# Patient Record
Sex: Female | Born: 1970 | Race: White | Hispanic: No | Marital: Married | Smoking: Never smoker
Health system: Southern US, Community
[De-identification: ages and names within clinical notes are randomized; demographics above are authoritative.]

## PROBLEM LIST (undated history)

## (undated) HISTORY — PX: BREAST BIOPSY: SHX20

## (undated) HISTORY — PX: DILATION AND CURETTAGE OF UTERUS: SHX78

---

## 2003-05-21 ENCOUNTER — Other Ambulatory Visit: Admission: RE | Admit: 2003-05-21 | Discharge: 2003-05-21 | Payer: Self-pay | Admitting: Obstetrics and Gynecology

## 2003-09-23 ENCOUNTER — Encounter (INDEPENDENT_AMBULATORY_CARE_PROVIDER_SITE_OTHER): Payer: Self-pay | Admitting: Specialist

## 2003-09-23 ENCOUNTER — Ambulatory Visit (HOSPITAL_COMMUNITY): Admission: RE | Admit: 2003-09-23 | Discharge: 2003-09-23 | Payer: Self-pay | Admitting: Obstetrics and Gynecology

## 2004-02-16 ENCOUNTER — Other Ambulatory Visit: Admission: RE | Admit: 2004-02-16 | Discharge: 2004-02-16 | Payer: Self-pay | Admitting: Obstetrics and Gynecology

## 2004-06-10 ENCOUNTER — Ambulatory Visit (HOSPITAL_COMMUNITY): Admission: RE | Admit: 2004-06-10 | Discharge: 2004-06-10 | Payer: Self-pay | Admitting: Obstetrics and Gynecology

## 2004-09-12 ENCOUNTER — Inpatient Hospital Stay (HOSPITAL_COMMUNITY): Admission: AD | Admit: 2004-09-12 | Discharge: 2004-09-15 | Payer: Self-pay | Admitting: Obstetrics and Gynecology

## 2004-10-22 ENCOUNTER — Other Ambulatory Visit: Admission: RE | Admit: 2004-10-22 | Discharge: 2004-10-22 | Payer: Self-pay | Admitting: Obstetrics and Gynecology

## 2007-06-14 ENCOUNTER — Encounter: Admission: RE | Admit: 2007-06-14 | Discharge: 2007-06-14 | Payer: Self-pay | Admitting: Obstetrics and Gynecology

## 2007-06-29 ENCOUNTER — Encounter: Admission: RE | Admit: 2007-06-29 | Discharge: 2007-06-29 | Payer: Self-pay | Admitting: Obstetrics and Gynecology

## 2008-03-04 ENCOUNTER — Encounter (INDEPENDENT_AMBULATORY_CARE_PROVIDER_SITE_OTHER): Payer: Self-pay | Admitting: Obstetrics and Gynecology

## 2008-03-04 ENCOUNTER — Ambulatory Visit (HOSPITAL_COMMUNITY): Admission: RE | Admit: 2008-03-04 | Discharge: 2008-03-04 | Payer: Self-pay | Admitting: Obstetrics and Gynecology

## 2009-02-16 ENCOUNTER — Inpatient Hospital Stay (HOSPITAL_COMMUNITY): Admission: AD | Admit: 2009-02-16 | Discharge: 2009-02-18 | Payer: Self-pay | Admitting: Obstetrics and Gynecology

## 2009-02-16 ENCOUNTER — Encounter (HOSPITAL_COMMUNITY): Payer: Self-pay | Admitting: Obstetrics and Gynecology

## 2010-07-14 ENCOUNTER — Encounter
Admission: RE | Admit: 2010-07-14 | Discharge: 2010-07-14 | Payer: Self-pay | Source: Home / Self Care | Attending: Obstetrics and Gynecology | Admitting: Obstetrics and Gynecology

## 2010-07-25 ENCOUNTER — Encounter: Payer: Self-pay | Admitting: Obstetrics and Gynecology

## 2010-10-09 LAB — CBC
MCHC: 34.5 g/dL (ref 30.0–36.0)
MCV: 90.5 fL (ref 78.0–100.0)
MCV: 90.5 fL (ref 78.0–100.0)
Platelets: 165 10*3/uL (ref 150–400)
Platelets: 191 10*3/uL (ref 150–400)
RBC: 3.83 MIL/uL — ABNORMAL LOW (ref 3.87–5.11)
WBC: 11 10*3/uL — ABNORMAL HIGH (ref 4.0–10.5)

## 2010-10-09 LAB — RPR: RPR Ser Ql: NONREACTIVE

## 2010-11-16 NOTE — Op Note (Signed)
Turner, Debbie               ACCOUNT NO.:  [id number]   MEDICAL RECORD NO.:  [id number]          PATIENT TYPE:  AMB   LOCATION:                                [facility]:  [facility]   PHYSICIAN:  Michelle L. Grewal, M.D.DATE OF BIRTH:  10-Apr-1971   DATE OF PROCEDURE:  DATE OF DISCHARGE:                               OPERATIVE REPORT   PREOPERATIVE DIAGNOSIS:  Missed abortion at 12 weeks with twins.   POSTOPERATIVE DIAGNOSIS:  Missed abortion at 12 weeks with twins.   PROCEDURE:  D&A with ultrasound guidance.   SURGEON:  Michelle L. Grewal, MD   ANESTHESIA:  LMA with local.   ESTIMATED BLOOD LOSS:  200 mL.   PROCEDURE:  The patient was taken to the operating room after informed  consent was obtained.  Laminaria removed that were placed the day before  by myself.  She was prepped and draped in usual fashion.  Ultrasound  arrived and placed a speculum in the vagina.  The cervix was grasped  with a tenaculum and a paracervical block was performed.  She was  already dilated about a centimeter and a half.  I inserted with  ultrasound guidance #12 suction curette and did a thorough suction  curettage with ultrasound observation that both pseudocysts were  suctioned through the curette.  After that, I inserted a sharp curette  and did a thorough uterine curettage again under ultrasound guidance.  I  placed polyp tissue forceps inside the uterine cavity, removed the  remainder of the placental tissue, and a final suction tube curettage  was performed and uterine cavity was clean.  The uterus decompressed  nicely.  She did have some moderate bleeding as we were doing the D&A.  I did give her Pitocin and she did get some Methergine as well.  By the  time I completed the procedure, we confirmed that she had no retained  tissue by ultrasound.  I then noted that her bleeding subsided.  All  instruments were removed from the vagina.  All sponge, lap, and  instrument counts were correct x2.   The patient will be sent home with  the Methergine series as well as pain medication.  She will follow up  with me in my office in 1 week.      Michelle L. Vincente Poli, M.D.  Electronically Signed     MLG/MEDQ  D:  03/04/2008  T:  03/05/2008  Job:  [id number]

## 2010-11-16 NOTE — Op Note (Signed)
NAMEASIANNA, BRUNDAGE               ACCOUNT NO.:  [id number]   MEDICAL RECORD NO.:  [id number]          PATIENT TYPE:  INP   LOCATION:  9123                          [facility]ACILITY:  [facility]   PHYSICIAN:  Michelle L. Grewal, M.D.DATE OF BIRTH:  1971/01/18   DATE OF PROCEDURE:  02/17/2009  DATE OF DISCHARGE:                               OPERATIVE REPORT   PREOPERATIVE DIAGNOSES:  Intrauterine pregnancy at 39 weeks,  polyhydramnios, failure to progress, and desires permanent  sterilization.   POSTOPERATIVE DIAGNOSES:  Intrauterine pregnancy at 39 weeks,  polyhydramnios, failure to progress, and desires permanent  sterilization, in transverse position.   PROCEDURES:  Primary low transverse cesarean section and bilateral tubal  ligation.   SURGEON:  Michelle L. Grewal, MD   ANESTHESIA:  Epidural.   FINDINGS:  Female infant, cephalic presentation, Apgars 8 at 1 minute  and 9 at 5 minutes, weighing 7 pounds 3 ounces.   SPECIMENS:  Fallopian tube segments sent to Pathology.   ESTIMATED BLOOD LOSS:  700 mL.   COMPLICATIONS:  None.   DESCRIPTION OF PROCEDURE:  The patient was taken to the operating room.  Her epidural was dosed.  She was found to be adequate.  Time-out was  performed.  A low transverse incision was made and it was carried down  to the fascia.  The fascia scored in the midline and extended laterally.  The rectus muscles were separated in the midline.  The peritoneum was  entered bluntly.  The peritoneal incision was then stretched.  The  bladder blade was inserted.  The lower uterine segment was identified.  The bladder flap was created sharply and then digitally.  The bladder  blade was then readjusted.  A low transverse incision was made in the  uterus.  The baby was in transverse position and the head was  hyperextended.  The head was flexed slightly, a vacuum extractor was  placed on the head, the baby was delivered with a vacuum extractor after  it was rotated.  The  baby was a female infant, Apgars 8 at 1 minute and  9 at 5 minutes, weighing 7 pounds 3 ounces.  The cord was clamped and  cut.  The baby was handed to the awaiting pediatrician.  The placenta  was manually removed, noted be normal intact with a three-vessel cord.  The uterus was exteriorized and cleared of all clots and debris.  The  uterine incision was closed using 0 chromic in a running locked stitch.  At this point, we performed a modified bilateral tubal ligation by  identifying each midportion of the fallopian tube and tying the  midportion off using plain gut suture x2 and then tied off knuckle of  each fallopian tube segment was then resected using Metzenbaum scissors.  The ends were then burned with the Bovie.  Hemostasis was excellent.  The uterus was returned to the abdomen.  All pedicles were inspected and  noted to be hemostatic.  The rectus muscles were reapproximated using 0  Vicryl.  The fascia was closed using 0 Vicryl, starting  each corner meeting in the midline  after irrigation of subcutaneous  layer.  The skin was closed with subcuticular using 3-0 Monocryl.  The  skin was closed with Dermabond.  All sponge, lap, and instrument counts  were correct x2.  The patient went to recovery room in stable condition.      Michelle L. Vincente Poli, M.D.  Electronically Signed     MLG/MEDQ  D:  02/17/2009  T:  02/17/2009  Job:  [id number]

## 2010-11-19 NOTE — Op Note (Signed)
NAME:  Debbie Turner, Debbie Turner                         ACCOUNT NO.:  [id number]   MEDICAL RECORD NO.:  [id number]                   PATIENT TYPE:  AMB   LOCATION:  DAY                                  FACILITY:  [facility]   PHYSICIAN:  Michelle L. Vincente Poli, M.D.            DATE OF BIRTH:  27-May-1971   DATE OF PROCEDURE:  09/23/2003  DATE OF DISCHARGE:                                 OPERATIVE REPORT   PREOPERATIVE DIAGNOSIS:  Missed abortion.   POSTOPERATIVE DIAGNOSIS:  Missed abortion.   PROCEDURE:  Dilation and evacuation.   SURGEON:  Dr. Vincente Poli.   ANESTHESIA:  MAC for paracervical.   DESCRIPTION OF PROCEDURE:  The patient was taken to the operating room.  She  was given sedation, placed in the low lithotomy position.  Abdomen and  vagina were prepped and draped in the usual sterile fashion.  An in-and-out  catheter was used to empty the bladder.  Exam under anesthesia revealed the  uterus to be approximately 6 weeks size and mid positional.  After a sterile  drape was applied, the speculum was inserted into the vagina; the cervix was  grasped with a tenaculum and a paracervical block was performed in the  standard fashion at 5 and 7 o'clock.  The uterus is sounded to 7 cm and was  noted to be in the midline.  The cervical internal os was gently dilated  using Pratt dilators to a #25.  A #7 suction cannula was inserted into the  uterus, and suction curettage was performed which was thorough and grossly  consistent with products of conception.  After the suction curettage was  performed, the curette was removed.  The cannula was removed, and a sharp  curette was inserted into the uterus, and the uterus was thoroughly curetted  of all tissue.  All four walls were clear of tissue.  A final suction  curettage was performed with very __________ scant tissue obtained.  At the  end of the procedure, all instruments were removed from the vagina.  There  was no vaginal bleeding noted. The patient  received antibiotics prior to the  procedure.  She did get Toradol after the procedure.  All sponge, lap, and  instrument counts were correct x 2.  The patient tolerated the procedure  well and went to recovery room in stable condition.  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  She had received RhoGAM while she was in [place] when this  missed AB was initially diagnosed 2 weeks ago.  So, she will not need the  RhoGAM today.                                               Michelle L. Vincente Poli, M.D.    Florestine Avers  D:  09/23/2003  T:  09/23/2003  Job:  [id number]

## 2011-04-06 LAB — RH IMMUNE GLOBULIN WORKUP (NOT WOMEN'S HOSP)
ABO/RH(D): O NEG
Antibody Screen: NEGATIVE

## 2011-04-06 LAB — CBC
HCT: 36.3
RBC: 4.19

## 2011-06-16 ENCOUNTER — Other Ambulatory Visit: Payer: Self-pay | Admitting: Obstetrics and Gynecology

## 2011-06-16 DIAGNOSIS — Z1231 Encounter for screening mammogram for malignant neoplasm of breast: Secondary | ICD-10-CM

## 2011-07-21 ENCOUNTER — Ambulatory Visit: Payer: Self-pay

## 2011-08-01 ENCOUNTER — Ambulatory Visit
Admission: RE | Admit: 2011-08-01 | Discharge: 2011-08-01 | Disposition: A | Discharge status: Home / Self Care | Payer: BC Managed Care – PPO | Source: Ambulatory Visit | Attending: Obstetrics and Gynecology | Admitting: Obstetrics and Gynecology

## 2011-08-01 DIAGNOSIS — Z1231 Encounter for screening mammogram for malignant neoplasm of breast: Secondary | ICD-10-CM

## 2012-08-24 ENCOUNTER — Other Ambulatory Visit: Payer: Self-pay | Admitting: Family Medicine

## 2012-08-24 DIAGNOSIS — Z1231 Encounter for screening mammogram for malignant neoplasm of breast: Secondary | ICD-10-CM

## 2012-09-27 ENCOUNTER — Ambulatory Visit: Payer: BC Managed Care – PPO

## 2012-10-15 ENCOUNTER — Ambulatory Visit: Payer: BC Managed Care – PPO

## 2012-11-28 ENCOUNTER — Ambulatory Visit: Payer: BC Managed Care – PPO

## 2012-11-29 ENCOUNTER — Ambulatory Visit
Admission: RE | Admit: 2012-11-29 | Discharge: 2012-11-29 | Disposition: A | Discharge status: Home / Self Care | Payer: BC Managed Care – PPO | Source: Ambulatory Visit | Attending: Family Medicine | Admitting: Family Medicine

## 2012-11-29 DIAGNOSIS — Z1231 Encounter for screening mammogram for malignant neoplasm of breast: Secondary | ICD-10-CM

## 2013-10-02 ENCOUNTER — Other Ambulatory Visit (HOSPITAL_COMMUNITY)
Admission: RE | Admit: 2013-10-02 | Discharge: 2013-10-02 | Disposition: A | Discharge status: Home / Self Care | Payer: BC Managed Care – PPO | Source: Ambulatory Visit | Attending: Family Medicine | Admitting: Family Medicine

## 2013-10-02 ENCOUNTER — Other Ambulatory Visit: Payer: Self-pay | Admitting: Family Medicine

## 2013-10-02 DIAGNOSIS — Z124 Encounter for screening for malignant neoplasm of cervix: Secondary | ICD-10-CM | POA: Insufficient documentation

## 2014-02-03 ENCOUNTER — Other Ambulatory Visit: Payer: Self-pay

## 2014-02-03 DIAGNOSIS — Z1231 Encounter for screening mammogram for malignant neoplasm of breast: Secondary | ICD-10-CM

## 2014-02-20 ENCOUNTER — Ambulatory Visit
Admission: RE | Admit: 2014-02-20 | Discharge: 2014-02-20 | Disposition: A | Discharge status: Home / Self Care | Payer: BC Managed Care – PPO | Source: Ambulatory Visit

## 2014-02-20 DIAGNOSIS — Z1231 Encounter for screening mammogram for malignant neoplasm of breast: Secondary | ICD-10-CM

## 2015-03-02 ENCOUNTER — Other Ambulatory Visit: Payer: Self-pay

## 2015-03-02 DIAGNOSIS — Z1231 Encounter for screening mammogram for malignant neoplasm of breast: Secondary | ICD-10-CM

## 2015-04-09 ENCOUNTER — Ambulatory Visit
Admission: RE | Admit: 2015-04-09 | Discharge: 2015-04-09 | Disposition: A | Discharge status: Home / Self Care | Payer: BLUE CROSS/BLUE SHIELD | Source: Ambulatory Visit

## 2015-04-09 DIAGNOSIS — Z1231 Encounter for screening mammogram for malignant neoplasm of breast: Secondary | ICD-10-CM

## 2016-04-07 DIAGNOSIS — R946 Abnormal results of thyroid function studies: Secondary | ICD-10-CM | POA: Diagnosis not present

## 2016-04-11 ENCOUNTER — Other Ambulatory Visit: Payer: Self-pay | Admitting: Family Medicine

## 2016-04-11 DIAGNOSIS — R946 Abnormal results of thyroid function studies: Secondary | ICD-10-CM | POA: Diagnosis not present

## 2016-04-11 DIAGNOSIS — Z1231 Encounter for screening mammogram for malignant neoplasm of breast: Secondary | ICD-10-CM

## 2016-07-26 ENCOUNTER — Ambulatory Visit: Payer: BLUE CROSS/BLUE SHIELD

## 2016-08-01 ENCOUNTER — Ambulatory Visit
Admission: RE | Admit: 2016-08-01 | Discharge: 2016-08-01 | Disposition: A | Discharge status: Home / Self Care | Payer: BLUE CROSS/BLUE SHIELD | Source: Ambulatory Visit | Attending: Family Medicine | Admitting: Family Medicine

## 2016-08-01 DIAGNOSIS — Z1231 Encounter for screening mammogram for malignant neoplasm of breast: Secondary | ICD-10-CM

## 2016-11-15 ENCOUNTER — Other Ambulatory Visit (HOSPITAL_COMMUNITY)
Admission: RE | Admit: 2016-11-15 | Discharge: 2016-11-15 | Disposition: A | Discharge status: Home / Self Care | Payer: BLUE CROSS/BLUE SHIELD | Source: Ambulatory Visit | Attending: Family Medicine | Admitting: Family Medicine

## 2016-11-15 DIAGNOSIS — Z131 Encounter for screening for diabetes mellitus: Secondary | ICD-10-CM | POA: Diagnosis not present

## 2016-11-15 DIAGNOSIS — Z0000 Encounter for general adult medical examination without abnormal findings: Secondary | ICD-10-CM | POA: Diagnosis not present

## 2016-11-15 DIAGNOSIS — Z124 Encounter for screening for malignant neoplasm of cervix: Secondary | ICD-10-CM | POA: Insufficient documentation

## 2016-11-15 DIAGNOSIS — Z13220 Encounter for screening for lipoid disorders: Secondary | ICD-10-CM | POA: Diagnosis not present

## 2016-11-16 ENCOUNTER — Other Ambulatory Visit: Payer: Self-pay | Admitting: Family Medicine

## 2016-11-24 LAB — CYTOLOGY - PAP: Diagnosis: NEGATIVE

## 2017-08-30 DIAGNOSIS — D171 Benign lipomatous neoplasm of skin and subcutaneous tissue of trunk: Secondary | ICD-10-CM | POA: Diagnosis not present

## 2017-10-04 DIAGNOSIS — D171 Benign lipomatous neoplasm of skin and subcutaneous tissue of trunk: Secondary | ICD-10-CM | POA: Diagnosis not present

## 2017-12-06 ENCOUNTER — Encounter: Payer: Self-pay | Admitting: Family Medicine

## 2017-12-06 ENCOUNTER — Ambulatory Visit (INDEPENDENT_AMBULATORY_CARE_PROVIDER_SITE_OTHER): Payer: BLUE CROSS/BLUE SHIELD | Admitting: Family Medicine

## 2017-12-06 DIAGNOSIS — M79671 Pain in right foot: Secondary | ICD-10-CM | POA: Diagnosis not present

## 2017-12-07 ENCOUNTER — Encounter: Payer: Self-pay | Admitting: Family Medicine

## 2017-12-07 DIAGNOSIS — M79671 Pain in right foot: Secondary | ICD-10-CM | POA: Insufficient documentation

## 2017-12-07 NOTE — Progress Notes (Signed)
PCP: Kelton Pillar, MD  Subjective:   HPI: Patient is a 47 y.o. female here for custom orthotics.  Patient comes in today for custom orthotics for plantar fasciitis. She has had 2 months of plantar right foot pain back at the heel. Pain was aching, burning. Up to 5/10 in the morning. 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 Tried aleve icing, stretching. Going to [place] in a few weeks. No skin changes, numbness.  History reviewed. No pertinent past medical history.  Current Outpatient Medications on File Prior to Visit  Medication Sig Dispense Refill  . estradiol (VIVELLE-DOT) 0.0375 MG/24HR PLACE 1 PATCH TO SKIN TWICE A WEEK TRANSDERMAL  0  . progesterone (PROMETRIUM) 100 MG capsule Take 100 mg by mouth daily.  0   No current facility-administered medications on file prior to visit.     History reviewed. No pertinent surgical history.  No Known Allergies  Social History   Socioeconomic History  . Marital status: Married    Spouse name: Not on file  . Number of children: Not on file  . Years of education: Not on file  . Highest education level: Not on file  Occupational History  . Not on file  Social Needs  . Financial resource strain: Not on file  . Food insecurity:    Worry: Not on file    Inability: Not on file  . Transportation needs:    Medical: Not on file    Non-medical: Not on file  Tobacco Use  . Smoking status: Never Smoker  . Smokeless tobacco: Never Used  Substance and Sexual Activity  . Alcohol use: Not on file  . Drug use: Not on file  . Sexual activity: Not on file  Lifestyle  . Physical activity:    Days per week: Not on file    Minutes per session: Not on file  . Stress: Not on file  Relationships  . Social connections:    Talks on phone: Not on file    Gets together: Not on file    Attends religious service: Not on file    Active member of club or organization: Not on file    Attends meetings of clubs or organizations: Not on file   Relationship status: Not on file  . Intimate partner violence:    Fear of current or ex partner: Not on file    Emotionally abused: Not on file    Physically abused: Not on file    Forced sexual activity: Not on file  Other Topics Concern  . Not on file  Social History Narrative  . Not on file    History reviewed. No pertinent family history.  BP 130/73   Pulse 82   Ht 5\' 8"  (1.727 m)   Wt 145 lb (65.8 kg)   LMP 11/03/2012   BMI 22.05 kg/m   Review of Systems: See HPI above.     Objective:  Physical Exam:  Gen: NAD, comfortable in exam room  Right foot/ankle: Mild cavus.  No gross deformity, swelling, ecchymoses.  No hallux rigidus or valgus.  Transverse arch collapse. FROM with 5/5 strength. TTP mildly plantar fascia insertion on calcaneus laterally. Negative ant drawer and talar tilt.   Negative syndesmotic compression. Negative calcaneal squeeze. Thompsons test negative. NV intact distally.  Left foot/ankle: Mild cavus.  No gross deformity, swelling, ecchymoses.  Transverse arch collapse.  No hallux rigidus or valgus. FROM with 5/5 strength No TTP. Negative ant drawer and talar tilt.  Negative syndesmotic compression. Thompsons test negative. NV intact distally.   Assessment & Plan:  1. Right foot pain - 2/2 plantar fasciitis.  Reviewed home exercises, stretches, icing, tylenol, motrin, arch binder.  Custom orthotics made today and felt comfortable.  Patient was fitted for a : standard, cushioned, semi-rigid orthotic. The orthotic was heated and afterward the patient stood on the orthotic blank positioned on the orthotic stand. The patient was positioned in subtalar neutral position and 10 degrees of ankle dorsiflexion in a weight bearing stance. After completion of molding, a stable base was applied to the orthotic blank. The blank was ground to a stable position for weight bearing. Size: 9 orange Base: blue med density eva Posting: none Additional  orthotic padding: none Total prep time 40 minutes - >50% of which spent on counseling, answering questions, reviewing how to use, what to be aware of to come in for alterations, how to break in orthotics.

## 2017-12-07 NOTE — Assessment & Plan Note (Signed)
2/2 plantar fasciitis.  Reviewed home exercises, stretches, icing, tylenol, motrin, arch binder.  Custom orthotics made today and felt comfortable.  Patient was fitted for a : standard, cushioned, semi-rigid orthotic. The orthotic was heated and afterward the patient stood on the orthotic blank positioned on the orthotic stand. The patient was positioned in subtalar neutral position and 10 degrees of ankle dorsiflexion in a weight bearing stance. After completion of molding, a stable base was applied to the orthotic blank. The blank was ground to a stable position for weight bearing. Size: 9 orange Base: blue med density eva Posting: none Additional orthotic padding: none Total prep time 40 minutes - >50% of which spent on counseling, answering questions, reviewing how to use, what to be aware of to come in for alterations, how to break in orthotics.

## 2017-12-11 ENCOUNTER — Encounter: Payer: BLUE CROSS/BLUE SHIELD | Admitting: Sports Medicine

## 2017-12-11 ENCOUNTER — Encounter

## 2017-12-13 ENCOUNTER — Other Ambulatory Visit: Payer: Self-pay | Admitting: Family Medicine

## 2017-12-13 DIAGNOSIS — M79671 Pain in right foot: Secondary | ICD-10-CM | POA: Diagnosis not present

## 2017-12-13 DIAGNOSIS — Z1231 Encounter for screening mammogram for malignant neoplasm of breast: Secondary | ICD-10-CM

## 2017-12-20 DIAGNOSIS — R946 Abnormal results of thyroid function studies: Secondary | ICD-10-CM | POA: Diagnosis not present

## 2017-12-20 DIAGNOSIS — Z0000 Encounter for general adult medical examination without abnormal findings: Secondary | ICD-10-CM | POA: Diagnosis not present

## 2017-12-20 DIAGNOSIS — Z136 Encounter for screening for cardiovascular disorders: Secondary | ICD-10-CM | POA: Diagnosis not present

## 2017-12-20 DIAGNOSIS — Z131 Encounter for screening for diabetes mellitus: Secondary | ICD-10-CM | POA: Diagnosis not present

## 2018-01-08 ENCOUNTER — Ambulatory Visit
Admission: RE | Admit: 2018-01-08 | Discharge: 2018-01-08 | Disposition: A | Discharge status: Home / Self Care | Payer: BLUE CROSS/BLUE SHIELD | Source: Ambulatory Visit | Attending: Family Medicine | Admitting: Family Medicine

## 2018-01-08 DIAGNOSIS — Z1231 Encounter for screening mammogram for malignant neoplasm of breast: Secondary | ICD-10-CM | POA: Diagnosis not present

## 2018-02-07 DIAGNOSIS — M8588 Other specified disorders of bone density and structure, other site: Secondary | ICD-10-CM | POA: Diagnosis not present

## 2018-02-07 DIAGNOSIS — Z780 Asymptomatic menopausal state: Secondary | ICD-10-CM | POA: Diagnosis not present

## 2018-03-15 DIAGNOSIS — M79671 Pain in right foot: Secondary | ICD-10-CM | POA: Diagnosis not present

## 2018-03-26 DIAGNOSIS — M79671 Pain in right foot: Secondary | ICD-10-CM | POA: Diagnosis not present

## 2018-05-11 ENCOUNTER — Telehealth: Payer: Self-pay | Admitting: *Deleted

## 2018-05-11 ENCOUNTER — Ambulatory Visit (INDEPENDENT_AMBULATORY_CARE_PROVIDER_SITE_OTHER): Payer: BLUE CROSS/BLUE SHIELD

## 2018-05-11 ENCOUNTER — Encounter: Payer: Self-pay | Admitting: Sports Medicine

## 2018-05-11 ENCOUNTER — Other Ambulatory Visit: Payer: Self-pay | Admitting: Sports Medicine

## 2018-05-11 ENCOUNTER — Ambulatory Visit (INDEPENDENT_AMBULATORY_CARE_PROVIDER_SITE_OTHER): Payer: BLUE CROSS/BLUE SHIELD | Admitting: Sports Medicine

## 2018-05-11 DIAGNOSIS — S96911A Strain of unspecified muscle and tendon at ankle and foot level, right foot, initial encounter: Secondary | ICD-10-CM

## 2018-05-11 DIAGNOSIS — M79671 Pain in right foot: Secondary | ICD-10-CM

## 2018-05-11 DIAGNOSIS — M7671 Peroneal tendinitis, right leg: Secondary | ICD-10-CM

## 2018-05-11 MED ORDER — DICLOFENAC SODIUM 75 MG PO TBEC: 75.0000 mg | DELAYED_RELEASE_TABLET | Freq: Two times a day (BID) | ORAL | 0 refills | Status: DC

## 2018-05-11 NOTE — Telephone Encounter (Signed)
Orders given to Gretta Arab, RN for pre-cert.

## 2018-05-11 NOTE — Telephone Encounter (Signed)
-----   Message from Landis Martins, [place] sent at 05/11/2018  9:53 AM EST ----- Regarding: MRI R foot/ankle R/o tear at peroneal tendon lateral heel/ankle pain since March

## 2018-05-11 NOTE — Progress Notes (Signed)
Subjective: Debbie Turner is a 47 y.o. female patient presents to office with complaint of moderate heel pain on the Right since March of this year.  Patient reports that she went on a vacation where she did a lot of walking and standing and since she has had pain at her heel.  Patient was treated by Dr. 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  Patient reports that currently she is stretching icing wearing braces and night splint and taking Aleve in order to deal with the pain.  Patient denies any known history of injury besides going on vacation and doing a lot of walking. Denies any other pedal complaints.   Review of Systems  All other systems reviewed and are negative.    Patient Active Problem List   Diagnosis Date Noted  . Right foot pain 12/07/2017    Current Outpatient Medications on File Prior to Visit  Medication Sig Dispense Refill  . estradiol (VIVELLE-DOT) 0.0375 MG/24HR PLACE 1 PATCH TO SKIN TWICE A WEEK TRANSDERMAL  0  . progesterone (PROMETRIUM) 100 MG capsule Take 100 mg by mouth daily.  0   No current facility-administered medications on file prior to visit.     No Known Allergies  Objective: Physical Exam General: The patient is alert and oriented x3 in no acute distress.  Dermatology: Skin is warm, dry and supple bilateral lower extremities. Nails 1-10 are normal. There is no erythema, edema, no eccymosis, no open lesions present. Integument is otherwise unremarkable.  Vascular: Dorsalis Pedis pulse and Posterior Tibial pulse are 2/4 bilateral. Capillary fill time is immediate to all digits.  Neurological: Grossly  intact to light touch with an achilles reflex of +2/5 and a  negative Tinel's sign bilateral.  Musculoskeletal: Tenderness to palpation at lateral heel along the peroneal tendon course on right, no pain at plantar fascia no pain with compression of calcaneus bilateral. No pain with tuning fork to calcaneus bilateral. No pain with calf compression bilateral. There is decreased Ankle joint range of motion bilateral. All other joints range of motion within normal limits bilateral. Strength 5/5 in all groups bilateral.    Xray, Right foot:  Normal osseous mineralization. Joint spaces preserved. No fracture/dislocation/boney destruction. Calcaneal spur present with mild thickening of plantar fascia. No other soft tissue abnormalities or radiopaque foreign bodies.   Assessment and Plan: Problem List Items Addressed This Visit      Other   Right foot pain - Primary   Relevant Medications   diclofenac (VOLTAREN) 75 MG EC tablet   Other Relevant Orders   DG Foot Complete Right    Other Visit Diagnoses    Tear of tendon of right foot, initial encounter       Relevant Medications   diclofenac (VOLTAREN) 75 MG EC tablet   Inflammatory pain of right heel          -Complete examination performed.  -Xrays reviewed -Discussed with patient in detail the condition of possible peroneal tendon tear since her symptoms are lateral on her heel and less likely plantar fasciitis, how this occurs and  general treatment options. Explained both conservative and surgical treatments.  -Prescribed diclofenac to take as instructed and advised patient to discontinue Aleve, recommend rest ice elevation and protection with ankle gauntlet as dispensed this visit -Ordered MRI for further evaluation of peroneal tendon tear on the right since pain is ongoing and advised patient to continue with good supportive shoes and to discontinue stretching to avoid any worsening of possible tendon tear or injury -Patient to return to  office after MRI or sooner if problems or questions arise.  Landis Martins, DPM

## 2018-05-11 NOTE — Patient Instructions (Signed)

## 2018-05-18 NOTE — Telephone Encounter (Signed)
[facility] scheduled pt for MRI [id number] and [phone] for 05/25/2018 arrive 5:15pm for 5:30pm imaging. Faxed orders to [facility].

## 2018-05-18 NOTE — Telephone Encounter (Signed)
I informed pt of 05/24/2018 appt at [facility].

## 2018-05-24 DIAGNOSIS — M722 Plantar fascial fibromatosis: Secondary | ICD-10-CM | POA: Diagnosis not present

## 2018-05-24 DIAGNOSIS — R600 Localized edema: Secondary | ICD-10-CM | POA: Diagnosis not present

## 2018-05-30 ENCOUNTER — Encounter: Payer: Self-pay | Admitting: Sports Medicine

## 2018-06-04 ENCOUNTER — Other Ambulatory Visit: Payer: Self-pay | Admitting: Sports Medicine

## 2018-06-04 DIAGNOSIS — M79671 Pain in right foot: Secondary | ICD-10-CM

## 2018-06-04 DIAGNOSIS — S96911A Strain of unspecified muscle and tendon at ankle and foot level, right foot, initial encounter: Secondary | ICD-10-CM

## 2018-06-06 ENCOUNTER — Telehealth: Payer: Self-pay | Admitting: Sports Medicine

## 2018-06-06 NOTE — Telephone Encounter (Signed)
Patient wanted update on her test results.

## 2018-06-06 NOTE — Telephone Encounter (Signed)
Debbie Turner states she will fax the results to [phone].

## 2018-12-31 ENCOUNTER — Other Ambulatory Visit: Payer: Self-pay | Admitting: Family Medicine

## 2018-12-31 DIAGNOSIS — Z1231 Encounter for screening mammogram for malignant neoplasm of breast: Secondary | ICD-10-CM

## 2019-02-12 ENCOUNTER — Ambulatory Visit
Admission: RE | Admit: 2019-02-12 | Discharge: 2019-02-12 | Disposition: A | Discharge status: Home / Self Care | Payer: BC Managed Care – PPO | Source: Ambulatory Visit | Attending: Family Medicine | Admitting: Family Medicine

## 2019-02-12 ENCOUNTER — Other Ambulatory Visit: Payer: Self-pay

## 2019-02-12 DIAGNOSIS — Z1231 Encounter for screening mammogram for malignant neoplasm of breast: Secondary | ICD-10-CM

## 2019-05-10 ENCOUNTER — Encounter: Payer: BC Managed Care – PPO | Admitting: Plastic Surgery

## 2019-05-14 DIAGNOSIS — D2361 Other benign neoplasm of skin of right upper limb, including shoulder: Secondary | ICD-10-CM | POA: Diagnosis not present

## 2019-05-14 DIAGNOSIS — L578 Other skin changes due to chronic exposure to nonionizing radiation: Secondary | ICD-10-CM | POA: Diagnosis not present

## 2019-05-14 DIAGNOSIS — D2271 Melanocytic nevi of right lower limb, including hip: Secondary | ICD-10-CM | POA: Diagnosis not present

## 2019-05-14 DIAGNOSIS — L821 Other seborrheic keratosis: Secondary | ICD-10-CM | POA: Diagnosis not present

## 2019-05-21 ENCOUNTER — Ambulatory Visit (INDEPENDENT_AMBULATORY_CARE_PROVIDER_SITE_OTHER): Payer: Self-pay | Admitting: Plastic Surgery

## 2019-05-21 ENCOUNTER — Other Ambulatory Visit: Payer: Self-pay

## 2019-05-21 ENCOUNTER — Encounter: Payer: Self-pay | Admitting: Plastic Surgery

## 2019-05-21 DIAGNOSIS — Z719 Counseling, unspecified: Secondary | ICD-10-CM | POA: Insufficient documentation

## 2019-05-21 NOTE — Progress Notes (Signed)
Botulinum Toxin Procedure Note  Procedure: Cosmetic botulinum toxin  Pre-operative Diagnosis: Dynamic rhytides  Post-operative Diagnosis: Same  Complications:  None  Brief history: The patient desires botulinum toxin injection of her forehead. I discussed with the patient this proposed procedure of botulinum toxin injections, which is customized depending on the particular needs of the patient. It is performed on facial rhytids as a temporary correction. The alternatives were discussed with the patient. The risks were addressed including bleeding, scarring, infection, damage to deeper structures, asymmetry, and chronic pain, which may occur infrequently after a procedure. The individual's choice to undergo a surgical procedure is based on the comparison of risks to potential benefits. Other risks include unsatisfactory results, brow ptosis, eyelid ptosis, allergic reaction, temporary paralysis, which should go away with time, bruising, blurring disturbances and delayed healing. Botulinum toxin injections do not arrest the aging process or produce permanent tightening of the eyelid.  Operative intervention maybe necessary to maintain the results of a blepharoplasty or botulinum toxin. The patient understands and wishes to proceed. An informed consent was signed and informational brochures given to her prior to the procedure.  Procedure: The area was prepped with alcohol and dried with a clean gauze. Using a clean technique, the botulinum toxin was diluted with 1.25 cc of preservative-free normal saline which was slowly injected with an 18 gauge needle in a tuberculin syringes.  A 32 gauge needles were then used to inject the botulinum toxin. This mixture allow for an aliquot of 5 units per 0.1 cc in each injection site.    Subsequently the mixture was injected in the glabellar and forehead area with preservation of the temporal branch to the lateral eyebrow as well as into each lateral canthal area  beginning from the lateral orbital rim medial to the zygomaticus major in 3 separate areas. A total of 26 Units of botulinum toxin was used. The forehead and glabellar area was injected with care to inject intramuscular only while holding pressure on the supratrochlear vessels in each area during each injection on either side of the medial corrugators. The injection proceeded vertically superiorly to the medial 2/3 of the frontalis muscle and superior 2/3 of the lateral frontalis, again with preservation of the frontal branch.  The midface area was injected at the 3 sub-regions of the mid-face: zygomaticomalar region, anteromedial cheek region, and submalar region for a total of one syringe on each side of the face. The technique used was serial puncture with equal injections in the 3 sub-regions: the zygomaticomalar region, the anteromedial cheek, and the submalar region.  No complications were noted. Light pressure was held for 5 minutes. She was instructed explicitly in post-operative care.  Botox LOT:  [id number] C2 EXP:  7/23

## 2019-08-05 ENCOUNTER — Encounter: Payer: Self-pay | Admitting: Internal Medicine

## 2019-08-05 DIAGNOSIS — Z13220 Encounter for screening for lipoid disorders: Secondary | ICD-10-CM | POA: Diagnosis not present

## 2019-08-05 DIAGNOSIS — R142 Eructation: Secondary | ICD-10-CM | POA: Diagnosis not present

## 2019-08-05 DIAGNOSIS — Z0000 Encounter for general adult medical examination without abnormal findings: Secondary | ICD-10-CM | POA: Diagnosis not present

## 2019-08-05 DIAGNOSIS — K219 Gastro-esophageal reflux disease without esophagitis: Secondary | ICD-10-CM | POA: Diagnosis not present

## 2019-08-05 DIAGNOSIS — R946 Abnormal results of thyroid function studies: Secondary | ICD-10-CM | POA: Diagnosis not present

## 2019-08-09 ENCOUNTER — Encounter: Payer: BC Managed Care – PPO | Admitting: Plastic Surgery

## 2019-08-09 ENCOUNTER — Encounter: Payer: Self-pay | Admitting: Nurse Practitioner

## 2019-08-09 ENCOUNTER — Ambulatory Visit (INDEPENDENT_AMBULATORY_CARE_PROVIDER_SITE_OTHER): Payer: BC Managed Care – PPO | Admitting: Nurse Practitioner

## 2019-08-09 VITALS — BP 116/78 | HR 84 | Temp 97.8°F | Ht 66.0 in | Wt 150.2 lb

## 2019-08-09 DIAGNOSIS — Z800 Family history of malignant neoplasm of digestive organs: Secondary | ICD-10-CM | POA: Diagnosis not present

## 2019-08-09 DIAGNOSIS — K219 Gastro-esophageal reflux disease without esophagitis: Secondary | ICD-10-CM

## 2019-08-09 DIAGNOSIS — R1012 Left upper quadrant pain: Secondary | ICD-10-CM

## 2019-08-09 NOTE — Patient Instructions (Signed)
If you are age 49 or older, your body mass index should be between 23-30. Your Body mass index is 24.25 kg/m. If this is out of the aforementioned range listed, please consider follow up with your Primary Care Provider.  If you are age 59 or younger, your body mass index should be between 19-25. Your Body mass index is 24.25 kg/m. If this is out of the aformentioned range listed, please consider follow up with your Primary Care Provider.   You have been scheduled for an endoscopy and colonoscopy. Please follow the written instructions given to you at your visit today. Please pick up your prep supplies at the pharmacy within the next 1-3 days. If you use inhalers (even only as needed), please bring them with you on the day of your procedure. Your physician has requested that you go to www.startemmi.com and enter the access code given to you at your visit today. This web site gives a general overview about your procedure. However, you should still follow specific instructions given to you by our office regarding your preparation for the procedure.  Thank you for choosing me and [facility].   Tye Savoy, NP

## 2019-08-09 NOTE — Progress Notes (Signed)
ASSESSMENT / PLAN:   8.  49 year old female with a 3 week history of LUQ discomfort which started after taking a few doses of NSAIDs.  Symptoms improved but not resolved on PPI  -Avoid NSAIDs for now -Continue daily PPI -Further evaluation at time of EGD  2.  Family history of esophageal cancer in mother.  Barrett's esophagus in maternal uncle.  -Given family history of esophageal cancer in primary relative as well as for evaluation of #1 patient will be scheduled for EGD. The risks and benefits of EGD were discussed and the patient agrees to proceed.  Husband is patient of Dr. Carlean Purl, patient requesting Dr. Carlean Purl as her primary GI  3.  Colon cancer screening.  At time of EGD patient will undergo a screening colonoscopy.  No blood in stool or other worrisome features. -The risks and benefits of colonoscopy with possible polypectomy / biopsies were discussed and the patient agrees to proceed.  4. ? GERD. Throat clearing, mainly in am. Could be post-nasal drip. 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  Se has a family history of Barrett's esophagus / esophageal cancer in mother.  Paternal uncle had Barrett's esophagus.  Maternal grandfather passed away with colon cancer.  Patient's brother has ulcerative colitis. Lille saw PCP for wellness exam on 08/05/2019.  She had begun having LUQ discomfort after taking NSAIDs following her second Covid injection 07/15/2019.  She was started on Prilosec which has helped but not alleviated the discomfort.  H pylori ab was negative.  No nausea or vomiting.  Weight is stable.  No bowel changes or blood in stool.  Debbie Turner has frequent throat clearing but mainly in the mornings.  Someone told her a couple of years ago that it could be GERD.  She has taken a PPI off and on  for this but really has not paid attention as to whether it helps.  She was having some mild dysphagia around the time she was having " thyroid problems".  Dysphagia has pretty much resolved.  Sometimes she has little difficulty swallowing saliva.   Data Reviewed:  H. pylori antibody negative.  TSH normal 0.66.  No past medical history on file. No pertinent PMH   Past Surgical History:  Procedure Laterality Date  . BREAST BIOPSY Right    No family history on file. Social History   Tobacco Use  . Smoking status: Never Smoker  . Smokeless tobacco: Never Used  Substance Use Topics  . Alcohol use: Not on file  . Drug use: Not on file   Current Outpatient Medications  Medication Sig Dispense Refill  . diclofenac (VOLTAREN) 75 MG EC tablet Take 1 tablet (75 mg total) by mouth 2 (two) times daily. (Patient not taking: Reported on 05/21/2019) 30 tablet 0  . estradiol (VIVELLE-DOT) 0.0375 MG/24HR PLACE 1 PATCH TO SKIN TWICE A WEEK TRANSDERMAL  0  . progesterone (PROMETRIUM) 100 MG capsule Take 100 mg by mouth daily.  0   No current facility-administered medications for this visit.   No Known Allergies   Review of Systems: All systems reviewed and negative except where noted in HPI.   Creatinine clearance cannot be calculated (No successful lab value  found.)   Physical Exam:    Wt Readings from Last 3 Encounters:  12/06/17 145 lb (65.8 kg)    LMP 11/03/2012  Constitutional:  Pleasant female in no acute distress. Psychiatric: Normal mood and affect. Behavior is normal. EENT: Pupils normal.  Conjunctivae are normal. No scleral icterus. Neck supple.  Cardiovascular: Normal rate, regular rhythm. No edema Pulmonary/chest: Effort normal and breath sounds normal. No wheezing, rales or rhonchi. Abdominal: Soft, nondistended, nontender. Bowel sounds active throughout. There are no masses palpable. No hepatomegaly. Neurological: Alert and oriented to person place and time. Skin:  Skin is warm and dry. No rashes noted.  Debbie Savoy, NP  08/09/2019, 8:39 AM  Cc:  Referring Provider Kelton Pillar, MD

## 2019-08-16 ENCOUNTER — Encounter: Payer: Self-pay | Admitting: Internal Medicine

## 2019-08-16 ENCOUNTER — Other Ambulatory Visit: Payer: Self-pay

## 2019-08-16 ENCOUNTER — Ambulatory Visit (AMBULATORY_SURGERY_CENTER): Payer: BC Managed Care – PPO | Admitting: Internal Medicine

## 2019-08-16 VITALS — BP 89/61 | HR 78 | Temp 96.8°F | Resp 14 | Ht 66.0 in | Wt 150.0 lb

## 2019-08-16 DIAGNOSIS — K219 Gastro-esophageal reflux disease without esophagitis: Secondary | ICD-10-CM

## 2019-08-16 DIAGNOSIS — Z1211 Encounter for screening for malignant neoplasm of colon: Secondary | ICD-10-CM | POA: Diagnosis not present

## 2019-08-16 DIAGNOSIS — K228 Other specified diseases of esophagus: Secondary | ICD-10-CM | POA: Diagnosis not present

## 2019-08-16 DIAGNOSIS — D125 Benign neoplasm of sigmoid colon: Secondary | ICD-10-CM

## 2019-08-16 DIAGNOSIS — Z800 Family history of malignant neoplasm of digestive organs: Secondary | ICD-10-CM | POA: Diagnosis not present

## 2019-08-16 DIAGNOSIS — R1012 Left upper quadrant pain: Secondary | ICD-10-CM | POA: Diagnosis not present

## 2019-08-16 MED ORDER — SODIUM CHLORIDE 0.9 % IV SOLN: 500.0000 mL | Freq: Once | INTRAVENOUS | Status: DC

## 2019-08-16 NOTE — Op Note (Signed)
[place] Patient Name: Debbie Turner Procedure Date: 08/16/2019 11:09 AM MRN: [id number] Endoscopist: Gatha Mayer , MD Age: 49 Referring MD:  Date of Birth: August 23, 1970 Gender: Female Account #: [id number] Procedure:                Upper GI endoscopy Indications:              Abdominal pain in the left upper quadrant, Family                            history of esophageal cancer Medicines:                Propofol per Anesthesia, Monitored Anesthesia Care Procedure:                Pre-Anesthesia Assessment:                           - Prior to the procedure, a History and Physical                            was performed, and patient medications and                            allergies were reviewed. The patient's tolerance of                            previous anesthesia was also reviewed. The risks                            and benefits of the procedure and the sedation                            options and risks were discussed with the patient.                            All questions were answered, and informed consent                            was obtained. Prior Anticoagulants: The patient has                            taken no previous anticoagulant or antiplatelet                            agents. ASA Grade Assessment: II - A patient with                            mild systemic disease. After reviewing the risks                            and benefits, the patient was deemed in                            satisfactory condition to undergo the procedure.  After obtaining informed consent, the endoscope was                            passed under direct vision. Throughout the                            procedure, the patient's blood pressure, pulse, and                            oxygen saturations were monitored continuously. The                            Endoscope was introduced through the mouth, and   advanced to the second part of duodenum. The upper                            GI endoscopy was accomplished without difficulty.                            The patient tolerated the procedure well. Scope In: Scope Out: Findings:                 The Z-line was irregular and was found at the                            gastroesophageal junction. Biopsies were taken with                            a cold forceps for histology. Verification of                            patient identification for the specimen was done.                            Estimated blood loss was minimal.                           The entire examined stomach was normal.                           The examined duodenum was normal.                           The cardia and gastric fundus were normal on                            retroflexion.                           The exam was otherwise without abnormality. Complications:            No immediate complications. Estimated Blood Loss:     Estimated blood loss was minimal. Impression:               - Z-line irregular, at the gastroesophageal  junction. Biopsied due to family history of                            esophageal cancer.                           - Normal stomach.                           - Normal examined duodenum.                           - The examination was otherwise normal. Recommendation:           - Patient has a contact number available for                            emergencies. The signs and symptoms of potential                            delayed complications were discussed with the                            patient. Return to normal activities tomorrow.                            Written discharge instructions were provided to the                            patient.                           - Resume previous diet.                           - Continue present medications.                           - Await pathology  results.                           - May discontinue pantoprazole Gatha Mayer, MD 08/16/2019 11:51:21 AM This report has been signed electronically.

## 2019-08-16 NOTE — Progress Notes (Signed)
Temp-JB VS-DT 

## 2019-08-16 NOTE — Progress Notes (Signed)
Report given to PACU, vss 

## 2019-08-16 NOTE — Progress Notes (Signed)
Called to room to assist during endoscopic procedure.  Patient ID and intended procedure confirmed with present staff. Received instructions for my participation in the procedure from the performing physician.  

## 2019-08-16 NOTE — Op Note (Signed)
[place] Patient Name: Debbie Turner Procedure Date: 08/16/2019 11:07 AM MRN: [id number] Endoscopist: Gatha Mayer , MD Age: 49 Referring MD:  Date of Birth: 1970/09/03 Gender: Female Account #: [id number] Procedure:                Colonoscopy Indications:              Screening for colorectal malignant neoplasm, This                            is the patient's first colonoscopy Medicines:                Propofol per Anesthesia, Monitored Anesthesia Care Procedure:                Pre-Anesthesia Assessment:                           - Prior to the procedure, a History and Physical                            was performed, and patient medications and                            allergies were reviewed. The patient's tolerance of                            previous anesthesia was also reviewed. The risks                            and benefits of the procedure and the sedation                            options and risks were discussed with the patient.                            All questions were answered, and informed consent                            was obtained. Prior Anticoagulants: The patient has                            taken no previous anticoagulant or antiplatelet                            agents. ASA Grade Assessment: II - A patient with                            mild systemic disease. After reviewing the risks                            and benefits, the patient was deemed in                            satisfactory condition to undergo the procedure.  After obtaining informed consent, the colonoscope                            was passed under direct vision. Throughout the                            procedure, the patient's blood pressure, pulse, and                            oxygen saturations were monitored continuously. The                            Colonoscope was introduced through the anus and   advanced to the the cecum, identified by                            appendiceal orifice and ileocecal valve. The                            patient tolerated the procedure well. The                            colonoscopy was somewhat difficult due to                            significant looping. Successful completion of the                            procedure was aided by applying abdominal pressure. Scope In: 11:24:47 AM Scope Out: 11:41:52 AM Scope Withdrawal Time: 0 hours 11 minutes 23 seconds  Total Procedure Duration: 0 hours 17 minutes 5 seconds  Findings:                 The perianal and digital rectal examinations were                            normal.                           A diminutive polyp was found in the distal sigmoid                            colon. The polyp was sessile. The polyp was removed                            with a cold snare. Resection and retrieval were                            complete. Verification of patient identification                            for the specimen was done. Estimated blood loss was                            minimal.  The exam was otherwise without abnormality on                            direct and retroflexion views. Complications:            No immediate complications. Estimated Blood Loss:     Estimated blood loss was minimal. Impression:               - One diminutive polyp in the distal sigmoid colon,                            removed with a cold snare. Resected and retrieved.                           - The examination was otherwise normal on direct                            and retroflexion views. Recommendation:           - Patient has a contact number available for                            emergencies. The signs and symptoms of potential                            delayed complications were discussed with the                            patient. Return to normal activities tomorrow.                             Written discharge instructions were provided to the                            patient.                           - Resume previous diet.                           - Continue present medications.                           - Repeat colonoscopy is recommended. The                            colonoscopy date will be determined after pathology                            results from today's exam become available for                            review. Gatha Mayer, MD 08/16/2019 11:53:36 AM This report has been signed electronically.

## 2019-08-16 NOTE — Patient Instructions (Addendum)
Handouts given:  Polyps Resume previous diet Continue present medications Await pathology results Discontinue pantoprazole    The gastroesophegeal junction or Z line is irregular. I checked with biopsies looking for intestinal metaplasia, which is a pre-cancerous change.  If you did not have the family history of esophageal cancer I would not have taken these biopsies as it is not recommended for such minor changes per updated guidelines.  I found and removed one diminutive or tiny colon polyp.  I will let you know pathology results and recommendations by phone or letter.  I do not think you need to continue pantoprazole.  I appreciate the opportunity to care for you. Gatha Mayer, MD, FACG  YOU HAD AN ENDOSCOPIC PROCEDURE TODAY AT [facility]:   Refer to the procedure report that was given to you for any specific questions about what was found during the examination.  If the procedure report does not answer your questions, please call your gastroenterologist to clarify.  If you requested that your care partner not be given the details of your procedure findings, then the procedure report has been included in a sealed envelope for you to review at your convenience later.  YOU SHOULD EXPECT: Some feelings of bloating in the abdomen. Passage of more gas than usual.  Walking can help get rid of the air that was put into your GI tract during the procedure and reduce the bloating. If you had a lower endoscopy (such as a colonoscopy or flexible sigmoidoscopy) you may notice spotting of blood in your stool or on the toilet paper. If you underwent a bowel prep for your procedure, you may not have a normal bowel movement for a few days.  Please Note:  You might notice some irritation and congestion in your nose or some drainage.  This is from the oxygen used during your procedure.  There is no need for concern and it should clear up in a day or so.  SYMPTOMS TO REPORT  IMMEDIATELY:   Following lower endoscopy (colonoscopy or flexible sigmoidoscopy):  Excessive amounts of blood in the stool  Significant tenderness or worsening of abdominal pains  Swelling of the abdomen that is new, acute  Fever of 100F or higher   Following upper endoscopy (EGD)  Vomiting of blood or coffee ground material  New chest pain or pain under the shoulder blades  Painful or persistently difficult swallowing  New shortness of breath  Fever of 100F or higher  Black, tarry-looking stools  For urgent or emergent issues, a gastroenterologist can be reached at any hour by calling [phone].   DIET:  We do recommend a small meal at first, but then you may proceed to your regular diet.  Drink plenty of fluids but you should avoid alcoholic beverages for 24 hours.  ACTIVITY:  You should plan to take it easy for the rest of today and you should NOT DRIVE or use heavy machinery until tomorrow (because of the sedation medicines used during the test).    FOLLOW UP: Our staff will call the number listed on your records 48-72 hours following your procedure to check on you and address any questions or concerns that you may have regarding the information given to you following your procedure. If we do not reach you, we will leave a message.  We will attempt to reach you two times.  During this call, we will ask if you have developed any symptoms of COVID 19. If you develop any  symptoms (ie: fever, flu-like symptoms, shortness of breath, cough etc.) before then, please call [phone].  If you test positive for Covid 19 in the 2 weeks post procedure, please call and report this information to [place].    If any biopsies were taken you will be contacted by phone or by letter within the next 1-3 weeks.  Please call us at [phone] if you have not heard about the biopsies in 3 weeks.    SIGNATURES/CONFIDENTIALITY: You and/or your care partner have signed paperwork which will be  entered into your electronic medical record.  These signatures attest to the fact that that the information above on your After Visit Summary has been reviewed and is understood.  Full responsibility of the confidentiality of this discharge information lies with you and/or your care-partner.

## 2019-08-20 ENCOUNTER — Telehealth: Payer: Self-pay

## 2019-08-20 NOTE — Telephone Encounter (Signed)
  Follow up Call-  Call back number 08/16/2019  Post procedure Call Back phone  # [phone]  Permission to leave phone message Yes  Some recent data might be hidden     Patient questions:  Do you have a fever, pain , or abdominal swelling? No. Pain Score  0 *  Have you tolerated food without any problems? Yes.    Have you been able to return to your normal activities? Yes.    Do you have any questions about your discharge instructions: Diet   No. Medications  No. Follow up visit  No.  Do you have questions or concerns about your Care? No.  Actions: * If pain score is 4 or above: No action needed, pain <4.  1. Have you developed a fever since your procedure? no  2.   Have you had an respiratory symptoms (SOB or cough) since your procedure? no  3.   Have you tested positive for COVID 19 since your procedure no  4.   Have you had any family members/close contacts diagnosed with the COVID 19 since your procedure?  no   If yes to any of these questions please route to Joylene John, RN and Alphonsa Gin, Therapist, sports.

## 2019-08-22 ENCOUNTER — Encounter: Payer: Self-pay | Admitting: Internal Medicine

## 2019-08-22 DIAGNOSIS — Z860101 Personal history of adenomatous and serrated colon polyps: Secondary | ICD-10-CM | POA: Insufficient documentation

## 2019-08-22 DIAGNOSIS — Z86010 Personal history of colonic polyps: Secondary | ICD-10-CM

## 2019-08-22 HISTORY — DX: Personal history of colonic polyps: Z86.010

## 2019-08-22 HISTORY — DX: Personal history of adenomatous and serrated colon polyps: Z86.0101

## 2019-08-23 ENCOUNTER — Encounter: Payer: BC Managed Care – PPO | Admitting: Plastic Surgery

## 2019-09-06 ENCOUNTER — Encounter: Payer: BC Managed Care – PPO | Admitting: Plastic Surgery

## 2019-09-24 ENCOUNTER — Ambulatory Visit: Payer: BC Managed Care – PPO | Admitting: Internal Medicine

## 2019-09-27 ENCOUNTER — Other Ambulatory Visit: Payer: Self-pay

## 2019-09-27 ENCOUNTER — Encounter: Payer: Self-pay | Admitting: Plastic Surgery

## 2019-09-27 ENCOUNTER — Ambulatory Visit (INDEPENDENT_AMBULATORY_CARE_PROVIDER_SITE_OTHER): Payer: Self-pay | Admitting: Plastic Surgery

## 2019-09-27 VITALS — BP 128/82 | HR 68 | Temp 98.7°F | Ht 66.0 in | Wt 150.0 lb

## 2019-09-27 DIAGNOSIS — Z719 Counseling, unspecified: Secondary | ICD-10-CM

## 2019-09-27 NOTE — Progress Notes (Signed)

## 2020-03-11 ENCOUNTER — Encounter: Payer: BC Managed Care – PPO | Admitting: Plastic Surgery

## 2020-04-21 ENCOUNTER — Other Ambulatory Visit: Payer: Self-pay | Admitting: Family Medicine

## 2020-04-21 DIAGNOSIS — Z1231 Encounter for screening mammogram for malignant neoplasm of breast: Secondary | ICD-10-CM

## 2020-05-16 ENCOUNTER — Ambulatory Visit: Payer: BC Managed Care – PPO

## 2020-06-14 DIAGNOSIS — Z20822 Contact with and (suspected) exposure to covid-19: Secondary | ICD-10-CM | POA: Diagnosis not present

## 2020-06-30 ENCOUNTER — Other Ambulatory Visit: Payer: Self-pay

## 2020-06-30 ENCOUNTER — Ambulatory Visit
Admission: RE | Admit: 2020-06-30 | Discharge: 2020-06-30 | Disposition: A | Discharge status: Home / Self Care | Payer: BC Managed Care – PPO | Source: Ambulatory Visit | Attending: Family Medicine | Admitting: Family Medicine

## 2020-06-30 DIAGNOSIS — Z1231 Encounter for screening mammogram for malignant neoplasm of breast: Secondary | ICD-10-CM | POA: Diagnosis not present

## 2020-07-02 ENCOUNTER — Ambulatory Visit: Payer: BC Managed Care – PPO

## 2020-08-26 DIAGNOSIS — Z124 Encounter for screening for malignant neoplasm of cervix: Secondary | ICD-10-CM | POA: Diagnosis not present

## 2020-08-26 DIAGNOSIS — Z13220 Encounter for screening for lipoid disorders: Secondary | ICD-10-CM | POA: Diagnosis not present

## 2020-08-26 DIAGNOSIS — Z131 Encounter for screening for diabetes mellitus: Secondary | ICD-10-CM | POA: Diagnosis not present

## 2020-08-26 DIAGNOSIS — K219 Gastro-esophageal reflux disease without esophagitis: Secondary | ICD-10-CM | POA: Diagnosis not present

## 2020-08-26 DIAGNOSIS — R946 Abnormal results of thyroid function studies: Secondary | ICD-10-CM | POA: Diagnosis not present

## 2020-08-26 DIAGNOSIS — Z0000 Encounter for general adult medical examination without abnormal findings: Secondary | ICD-10-CM | POA: Diagnosis not present

## 2020-11-06 DIAGNOSIS — H5213 Myopia, bilateral: Secondary | ICD-10-CM | POA: Diagnosis not present

## 2020-11-06 DIAGNOSIS — H04122 Dry eye syndrome of left lacrimal gland: Secondary | ICD-10-CM | POA: Diagnosis not present

## 2021-04-05 DIAGNOSIS — L821 Other seborrheic keratosis: Secondary | ICD-10-CM | POA: Diagnosis not present

## 2021-04-05 DIAGNOSIS — L570 Actinic keratosis: Secondary | ICD-10-CM | POA: Diagnosis not present

## 2021-04-28 NOTE — Progress Notes (Signed)
[facility] New Patient Evaluation and Consultation  Referring Provider: Kelton Pillar, MD PCP: Kelton Pillar, MD Date of Service: 04/29/2021  SUBJECTIVE Chief Complaint: New Patient (Initial Visit)- incontinence  History of Present Illness: Debbie Turner is a 50 y.o. White or Caucasian female presenting for evaluation of stress incontinence.     Urinary Symptoms: Leaks urine with cough/ sneeze, laughing, exercise, lifting, going from sitting to standing, during sex, with a full bladder, with movement to the bathroom, and with urgency Leaks 3-4 time(s) per day. Tries not to drink as much water so that she doesn't have to go to the bathroom. UUI> SUI, but leaks a lot with exercise.  Pad use: 3 pads per day.   She is bothered by her UI symptoms.  Day time voids 5-6.  Nocturia: 1 times per night to void. Voiding dysfunction: she empties her bladder well.  does not use a catheter to empty bladder.  When urinating, she feels dribbling after finishing Drinks: coffee in AM, otherwise water during the day UTIs:  0  UTI's in the last year.   Denies history of blood in urine and kidney or bladder stones  Pelvic Organ Prolapse Symptoms:                  She Denies a feeling of a bulge the vaginal area.   Bowel Symptom: Bowel movements: 1 time(s) per day Stool consistency: soft  Straining: no.  Splinting: no.  Incomplete evacuation: no.  She Denies accidental bowel leakage / fecal incontinence Bowel regimen: diet  Sexual Function Sexually active: yes.  Sexual orientation:  heterosexual Pain with sex: No  Pelvic Pain Denies pelvic pain   Past Medical History:  Past Medical History:  Diagnosis Date   Hx of adenomatous polyp of colon 08/22/2019     Past Surgical History:   Past Surgical History:  Procedure Laterality Date   BREAST BIOPSY Right    CESAREAN SECTION     DILATION AND CURETTAGE OF UTERUS     x2     Past OB/GYN History: OB History   Gravida Para Term Preterm AB Living  6       2 4   SAB IAB Ectopic Multiple Live Births  2       4    # Outcome Date GA Lbr Len/2nd Weight Sex Delivery Anes PTL Lv  6 Gravida           5 Gravida           4 Gravida           3 Gravida           2 SAB           1 SAB             Vaginal deliveries: 2,  Vacuum deliveries: 1, Cesarean section: 1 Menopausal: Yes, Denies vaginal bleeding since menopause Last pap smear was feb 2022- neg.  Any history of abnormal pap smears: yes.   Medications: She has a current medication list which includes the following prescription(s): calcium carbonate-vit d-min, mirabegron er, and multivitamin.   Allergies: Patient has No Known Allergies.   Social History:  Social History   Tobacco Use   Smoking status: Never   Smokeless tobacco: Never  Vaping Use   Vaping Use: Never used  Substance Use Topics   Alcohol use: Yes    Comment: rare   Drug use: Never    Relationship status: married She lives  with husband and 2 children.   She is employed as a Immunologist at [employer]. Regular exercise: Yes: walking 30-45 min 5x week History of abuse: No  Family History:   Family History  Problem Relation Age of Onset   Esophageal cancer Mother    Hypertension Father    Ulcerative colitis Brother    Colon cancer Maternal Grandfather    Barrett's esophagus Maternal Uncle    Rectal cancer Neg Hx    Stomach cancer Neg Hx      Review of Systems: Review of Systems  Constitutional:  Positive for malaise/fatigue. Negative for fever and weight loss.  Respiratory:  Negative for cough, shortness of breath and wheezing.   Cardiovascular:  Positive for palpitations. Negative for chest pain and leg swelling.  Gastrointestinal:  Negative for abdominal pain and blood in stool.  Genitourinary:  Negative for dysuria.  Musculoskeletal:  Negative for myalgias.  Skin:  Negative for rash.  Neurological:  Positive for headaches. Negative for dizziness.  Endo/Heme/Allergies:   Does not bruise/bleed easily.  Psychiatric/Behavioral:  Negative for depression. The patient is nervous/anxious.     OBJECTIVE Physical Exam: Vitals:   04/29/21 1039  BP: 125/80  Pulse: 86  Weight: 150 lb (68 kg)  Height: 5\' 6"  (1.676 m)    Physical Exam Constitutional:      General: She is not in acute distress. Pulmonary:     Effort: Pulmonary effort is normal.  Abdominal:     General: There is no distension.     Palpations: Abdomen is soft.     Tenderness: There is no abdominal tenderness. There is no rebound.  Musculoskeletal:        General: No swelling. Normal range of motion.  Skin:    General: Skin is warm and dry.     Findings: No rash.  Neurological:     Mental Status: She is alert and oriented to person, place, and time.  Psychiatric:        Mood and Affect: Mood normal.        Behavior: Behavior normal.     GU / Detailed Urogynecologic Evaluation:  Pelvic Exam: Normal external female genitalia; Bartholin's and Skene's glands normal in appearance; urethral meatus normal in appearance, no urethral masses or discharge.   CST: negative  Speculum exam reveals normal vaginal mucosa without atrophy. Cervix normal appearance. Uterus normal single, nontender. Adnexa no mass, fullness, tenderness.    Pelvic floor strength II/V  Pelvic floor musculature: Right levator non-tender, Right obturator non-tender, Left levator non-tender, Left obturator non-tender  POP-Q:   POP-Q  -3                                            Aa   -3                                           Ba  -7.5                                              C   3  Gh  3.5                                            Pb  9                                            tvl   -2                                            Ap  -2                                            Bp  -8                                              D     Rectal Exam:   Normal external rectum  Post-Void Residual (PVR) by Bladder Scan: In order to evaluate bladder emptying, we discussed obtaining a postvoid residual and she agreed to this procedure.  Procedure: The ultrasound unit was placed on the patient's abdomen in the suprapubic region after the patient had voided. A PVR of 6 ml was obtained by bladder scan.  Laboratory Results: POC urine: negative   ASSESSMENT AND PLAN Ms. Carrender is a 50 y.o. with:  1. Overactive bladder   2. SUI (stress urinary incontinence, female)   3. Urinary frequency     OAB - We discussed the symptoms of overactive bladder (OAB), which include urinary urgency, urinary frequency, nocturia, with or without urge incontinence.  While we do not know the exact etiology of OAB, several treatment options exist. We discussed management including behavioral therapy (decreasing bladder irritants, urge suppression strategies, timed voids, bladder retraining), physical therapy, medication; for refractory cases posterior tibial nerve stimulation, sacral neuromodulation, and intravesical botulinum toxin injection.  - Prescribed Myrbetriq 25mg  daily. For Beta-3 agonist medication, we discussed the potential side effect of elevated blood pressure which is more likely to occur in individuals with uncontrolled hypertension.  2. SUI -For treatment of stress urinary incontinence,  non-surgical options include expectant management, weight loss, physical therapy, as well as a pessary.  Surgical options include a midurethral sling, Burch urethropexy, and transurethral injection of a bulking agent. - She is interested in starting with physical therapy, referral placed.   Follow up 6 weeks  Jaquita Folds, MD   Medical Decision Making:  - Reviewed/ ordered a clinical laboratory test - Review and summation of prior records

## 2021-04-29 ENCOUNTER — Encounter: Payer: Self-pay | Admitting: Obstetrics and Gynecology

## 2021-04-29 ENCOUNTER — Other Ambulatory Visit: Payer: Self-pay

## 2021-04-29 ENCOUNTER — Ambulatory Visit (INDEPENDENT_AMBULATORY_CARE_PROVIDER_SITE_OTHER): Payer: BC Managed Care – PPO | Admitting: Obstetrics and Gynecology

## 2021-04-29 VITALS — BP 125/80 | HR 86 | Ht 66.0 in | Wt 150.0 lb

## 2021-04-29 DIAGNOSIS — N3281 Overactive bladder: Secondary | ICD-10-CM

## 2021-04-29 DIAGNOSIS — R350 Frequency of micturition: Secondary | ICD-10-CM

## 2021-04-29 DIAGNOSIS — N393 Stress incontinence (female) (male): Secondary | ICD-10-CM

## 2021-04-29 LAB — POCT URINALYSIS DIPSTICK
Appearance: NORMAL
Bilirubin, UA: NEGATIVE
Blood, UA: NEGATIVE
Glucose, UA: NEGATIVE
Ketones, UA: NEGATIVE
Leukocytes, UA: NEGATIVE
Nitrite, UA: NEGATIVE
Protein, UA: NEGATIVE
Spec Grav, UA: 1.01 (ref 1.010–1.025)
Urobilinogen, UA: 0.2 E.U./dL
pH, UA: 6.5 (ref 5.0–8.0)

## 2021-04-29 MED ORDER — MIRABEGRON ER 25 MG PO TB24: 25.0000 mg | ORAL_TABLET | Freq: Every day | ORAL | 5 refills | Status: AC

## 2021-06-01 ENCOUNTER — Ambulatory Visit: Payer: BC Managed Care – PPO | Admitting: Physical Therapy

## 2021-06-14 ENCOUNTER — Ambulatory Visit: Payer: BC Managed Care – PPO | Admitting: Obstetrics and Gynecology

## 2021-06-24 ENCOUNTER — Encounter: Payer: BC Managed Care – PPO | Admitting: Physical Therapy

## 2021-07-01 ENCOUNTER — Encounter: Payer: BC Managed Care – PPO | Admitting: Physical Therapy

## 2021-07-08 ENCOUNTER — Encounter: Payer: BC Managed Care – PPO | Admitting: Physical Therapy

## 2021-07-15 ENCOUNTER — Encounter: Payer: BC Managed Care – PPO | Admitting: Physical Therapy

## 2021-08-05 ENCOUNTER — Other Ambulatory Visit: Payer: Self-pay | Admitting: Family Medicine

## 2021-08-05 ENCOUNTER — Other Ambulatory Visit: Payer: Self-pay | Admitting: Internal Medicine

## 2021-08-05 DIAGNOSIS — Z1231 Encounter for screening mammogram for malignant neoplasm of breast: Secondary | ICD-10-CM

## 2021-08-23 ENCOUNTER — Ambulatory Visit: Payer: BC Managed Care – PPO

## 2021-08-26 ENCOUNTER — Other Ambulatory Visit: Payer: Self-pay

## 2021-08-26 ENCOUNTER — Ambulatory Visit
Admission: RE | Admit: 2021-08-26 | Discharge: 2021-08-26 | Disposition: A | Discharge status: Home / Self Care | Payer: BC Managed Care – PPO | Source: Ambulatory Visit | Attending: Family Medicine | Admitting: Family Medicine

## 2021-08-26 DIAGNOSIS — Z1231 Encounter for screening mammogram for malignant neoplasm of breast: Secondary | ICD-10-CM | POA: Diagnosis not present

## 2022-02-08 DIAGNOSIS — Z0000 Encounter for general adult medical examination without abnormal findings: Secondary | ICD-10-CM | POA: Diagnosis not present

## 2022-02-08 DIAGNOSIS — G43009 Migraine without aura, not intractable, without status migrainosus: Secondary | ICD-10-CM | POA: Diagnosis not present

## 2022-02-08 DIAGNOSIS — E0789 Other specified disorders of thyroid: Secondary | ICD-10-CM | POA: Diagnosis not present

## 2022-02-08 DIAGNOSIS — G43001 Migraine without aura, not intractable, with status migrainosus: Secondary | ICD-10-CM | POA: Diagnosis not present

## 2022-05-30 DIAGNOSIS — Z23 Encounter for immunization: Secondary | ICD-10-CM | POA: Diagnosis not present

## 2022-08-04 ENCOUNTER — Other Ambulatory Visit: Payer: Self-pay | Admitting: Family Medicine

## 2022-08-04 DIAGNOSIS — Z1231 Encounter for screening mammogram for malignant neoplasm of breast: Secondary | ICD-10-CM

## 2022-08-24 DIAGNOSIS — M25512 Pain in left shoulder: Secondary | ICD-10-CM | POA: Diagnosis not present

## 2022-08-30 DIAGNOSIS — M25512 Pain in left shoulder: Secondary | ICD-10-CM | POA: Diagnosis not present

## 2022-08-30 DIAGNOSIS — M6281 Muscle weakness (generalized): Secondary | ICD-10-CM | POA: Diagnosis not present

## 2022-09-06 DIAGNOSIS — M25512 Pain in left shoulder: Secondary | ICD-10-CM | POA: Diagnosis not present

## 2022-09-06 DIAGNOSIS — M6281 Muscle weakness (generalized): Secondary | ICD-10-CM | POA: Diagnosis not present

## 2022-09-13 DIAGNOSIS — M25512 Pain in left shoulder: Secondary | ICD-10-CM | POA: Diagnosis not present

## 2022-09-13 DIAGNOSIS — M6281 Muscle weakness (generalized): Secondary | ICD-10-CM | POA: Diagnosis not present

## 2022-09-21 ENCOUNTER — Ambulatory Visit
Admission: RE | Admit: 2022-09-21 | Discharge: 2022-09-21 | Disposition: A | Discharge status: Home / Self Care | Payer: BC Managed Care – PPO | Source: Ambulatory Visit | Attending: Family Medicine | Admitting: Family Medicine

## 2022-09-21 DIAGNOSIS — Z1231 Encounter for screening mammogram for malignant neoplasm of breast: Secondary | ICD-10-CM | POA: Diagnosis not present

## 2022-09-22 DIAGNOSIS — M6281 Muscle weakness (generalized): Secondary | ICD-10-CM | POA: Diagnosis not present

## 2022-09-22 DIAGNOSIS — M25512 Pain in left shoulder: Secondary | ICD-10-CM | POA: Diagnosis not present

## 2023-03-13 DIAGNOSIS — L821 Other seborrheic keratosis: Secondary | ICD-10-CM | POA: Diagnosis not present

## 2023-03-13 DIAGNOSIS — D2262 Melanocytic nevi of left upper limb, including shoulder: Secondary | ICD-10-CM | POA: Diagnosis not present

## 2023-03-13 DIAGNOSIS — D2361 Other benign neoplasm of skin of right upper limb, including shoulder: Secondary | ICD-10-CM | POA: Diagnosis not present

## 2023-03-13 DIAGNOSIS — D2261 Melanocytic nevi of right upper limb, including shoulder: Secondary | ICD-10-CM | POA: Diagnosis not present

## 2023-05-24 DIAGNOSIS — Z0000 Encounter for general adult medical examination without abnormal findings: Secondary | ICD-10-CM | POA: Diagnosis not present

## 2023-05-24 DIAGNOSIS — G43009 Migraine without aura, not intractable, without status migrainosus: Secondary | ICD-10-CM | POA: Diagnosis not present

## 2023-05-24 DIAGNOSIS — Z23 Encounter for immunization: Secondary | ICD-10-CM | POA: Diagnosis not present

## 2023-05-24 DIAGNOSIS — Z6824 Body mass index (BMI) 24.0-24.9, adult: Secondary | ICD-10-CM | POA: Diagnosis not present

## 2023-05-24 DIAGNOSIS — E0789 Other specified disorders of thyroid: Secondary | ICD-10-CM | POA: Diagnosis not present

## 2023-08-15 ENCOUNTER — Encounter (INDEPENDENT_AMBULATORY_CARE_PROVIDER_SITE_OTHER): Payer: Self-pay

## 2023-08-21 DIAGNOSIS — E7800 Pure hypercholesterolemia, unspecified: Secondary | ICD-10-CM | POA: Diagnosis not present

## 2023-08-21 DIAGNOSIS — G43009 Migraine without aura, not intractable, without status migrainosus: Secondary | ICD-10-CM | POA: Diagnosis not present

## 2023-08-21 DIAGNOSIS — E0789 Other specified disorders of thyroid: Secondary | ICD-10-CM | POA: Diagnosis not present

## 2023-11-24 ENCOUNTER — Other Ambulatory Visit: Payer: Self-pay | Admitting: Family Medicine

## 2023-11-24 DIAGNOSIS — Z0000 Encounter for general adult medical examination without abnormal findings: Secondary | ICD-10-CM

## 2023-12-03 IMAGING — MG MM DIGITAL SCREENING BILAT W/ TOMO AND CAD
8 series · 9 of 24 positions shown · non-contrast
Comparison: Previous exam(s).

CLINICAL DATA: Screening.

EXAM:
DIGITAL SCREENING BILATERAL MAMMOGRAM WITH TOMOSYNTHESIS AND CAD
TECHNIQUE: Bilateral screening digital craniocaudal and mediolateral oblique
mammograms were obtained. Bilateral screening digital breast
tomosynthesis was performed. The images were evaluated with
computer-aided detection.

[L CC synth-2D]
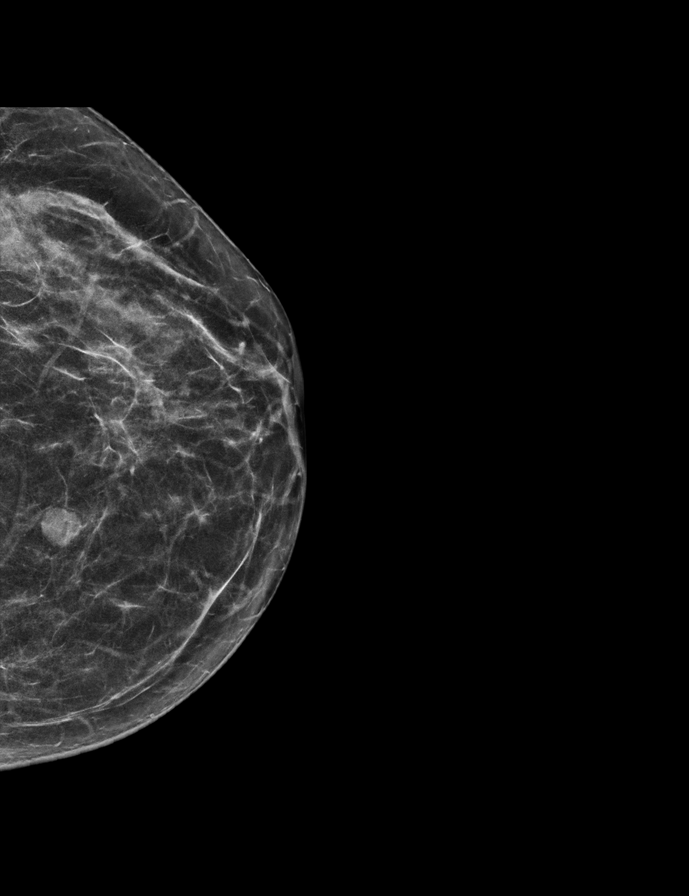

[R CC synth-2D]
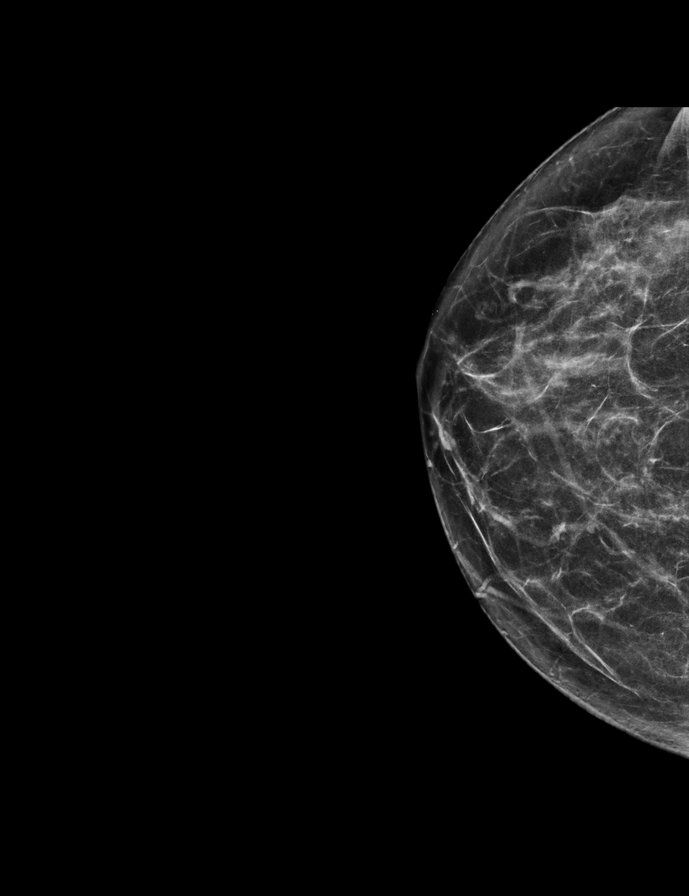

[L MLO synth-2D]
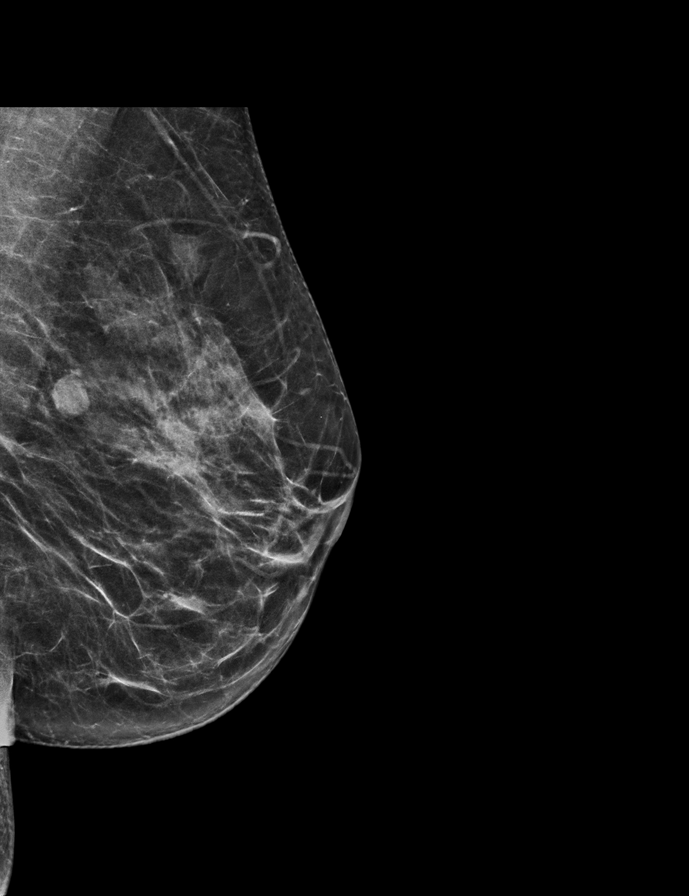

[R MLO synth-2D]
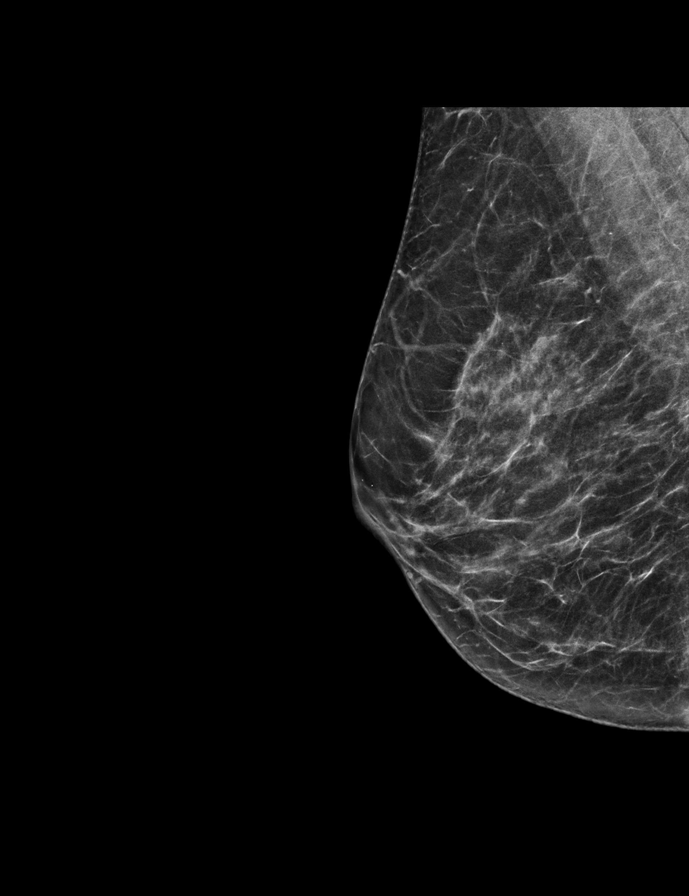

[R MLO tomo · 2 of 54 frames shown]
[frame 18/54]
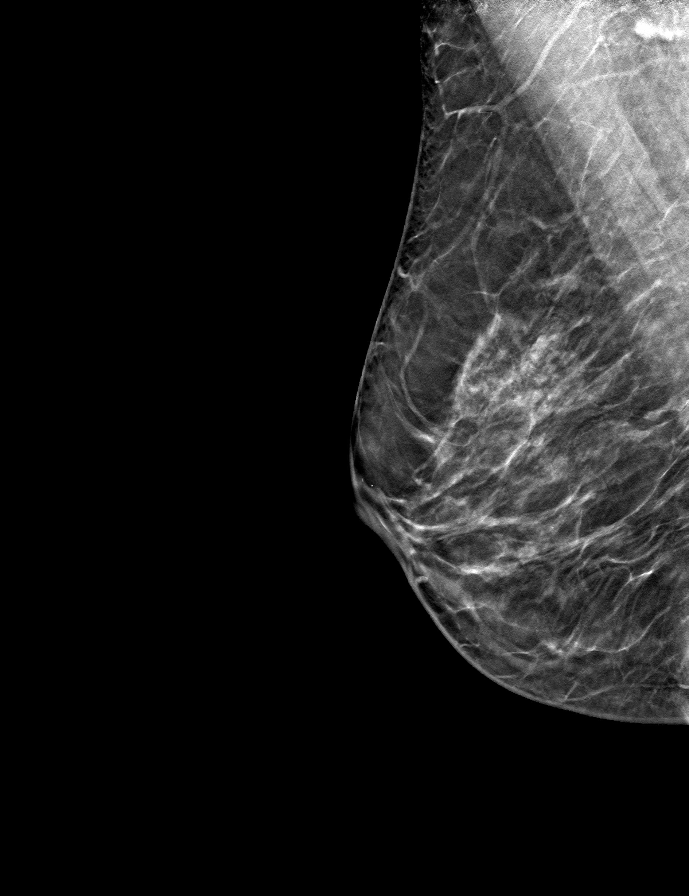
[frame 27/54]
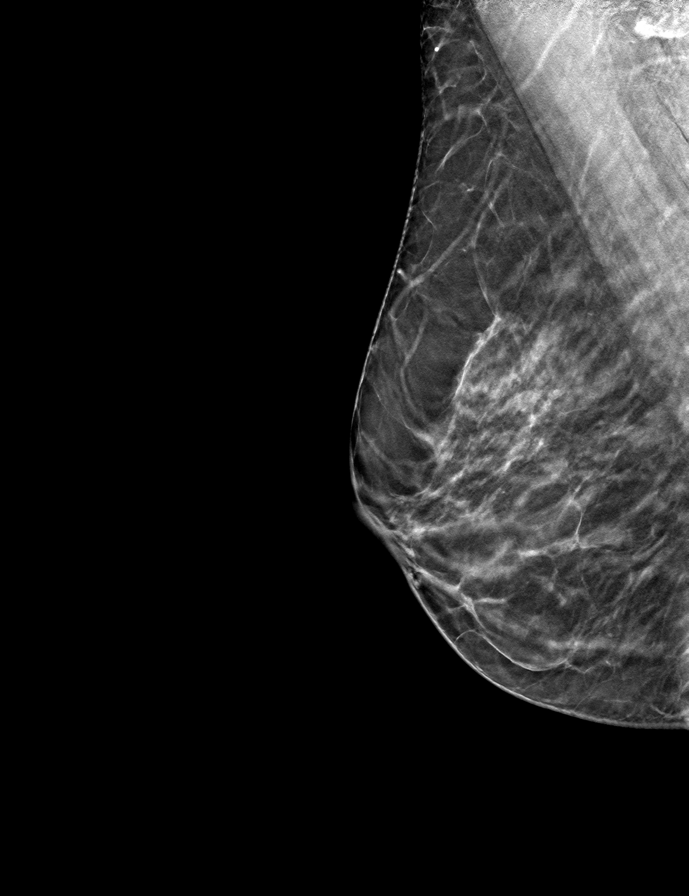

[L MLO tomo · tomo slice 29/56.0]
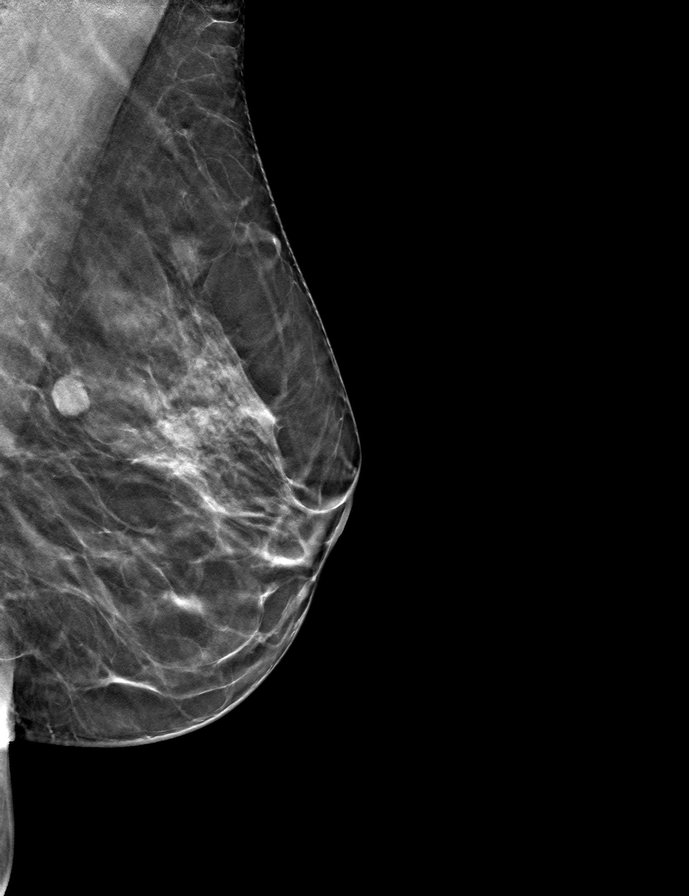

[R CC tomo · tomo slice 29/57.0]
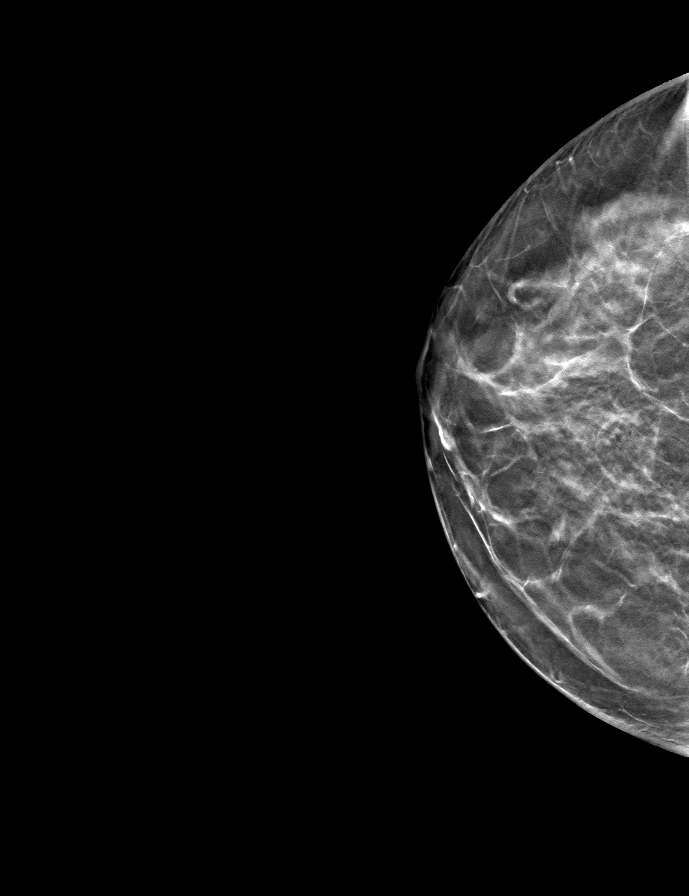

[L CC tomo · tomo slice 27/53.0]
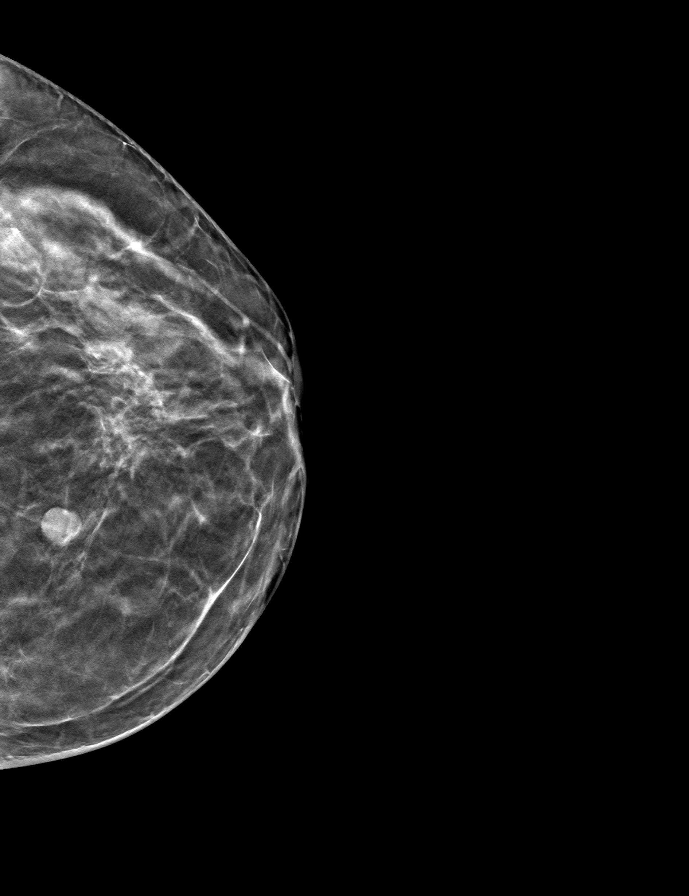

[9 of 24 positions shown; findings below may reference images not displayed]

ACR Breast Density Category b: There are scattered areas of
fibroglandular density.
FINDINGS: There are no findings suspicious for malignancy.
IMPRESSION: No mammographic evidence of malignancy. A result letter of this
screening mammogram will be mailed directly to the patient.

RECOMMENDATION:
Screening mammogram in one year. (Code:51-O-LD2)

BI-RADS CATEGORY  1: Negative.

## 2023-12-04 ENCOUNTER — Ambulatory Visit
Admission: RE | Admit: 2023-12-04 | Discharge: 2023-12-04 | Disposition: A | Discharge status: Home / Self Care | Source: Ambulatory Visit | Attending: Family Medicine | Admitting: Family Medicine

## 2023-12-04 ENCOUNTER — Ambulatory Visit

## 2023-12-04 DIAGNOSIS — Z0000 Encounter for general adult medical examination without abnormal findings: Secondary | ICD-10-CM

## 2023-12-08 DIAGNOSIS — Z780 Asymptomatic menopausal state: Secondary | ICD-10-CM | POA: Diagnosis not present

## 2023-12-08 DIAGNOSIS — Z13820 Encounter for screening for osteoporosis: Secondary | ICD-10-CM | POA: Diagnosis not present

## 2023-12-08 DIAGNOSIS — M810 Age-related osteoporosis without current pathological fracture: Secondary | ICD-10-CM | POA: Diagnosis not present

## 2023-12-08 DIAGNOSIS — M8589 Other specified disorders of bone density and structure, multiple sites: Secondary | ICD-10-CM | POA: Diagnosis not present

## 2024-01-17 DIAGNOSIS — M810 Age-related osteoporosis without current pathological fracture: Secondary | ICD-10-CM | POA: Diagnosis not present

## 2024-01-17 DIAGNOSIS — E559 Vitamin D deficiency, unspecified: Secondary | ICD-10-CM | POA: Diagnosis not present

## 2024-01-17 DIAGNOSIS — R5383 Other fatigue: Secondary | ICD-10-CM | POA: Diagnosis not present

## 2024-01-19 ENCOUNTER — Encounter: Payer: Self-pay | Admitting: Advanced Practice Midwife

## 2024-02-22 DIAGNOSIS — M810 Age-related osteoporosis without current pathological fracture: Secondary | ICD-10-CM | POA: Diagnosis not present

## 2024-03-12 DIAGNOSIS — L570 Actinic keratosis: Secondary | ICD-10-CM | POA: Diagnosis not present

## 2024-03-12 DIAGNOSIS — D2371 Other benign neoplasm of skin of right lower limb, including hip: Secondary | ICD-10-CM | POA: Diagnosis not present

## 2024-03-12 DIAGNOSIS — D2271 Melanocytic nevi of right lower limb, including hip: Secondary | ICD-10-CM | POA: Diagnosis not present

## 2024-03-12 DIAGNOSIS — D2262 Melanocytic nevi of left upper limb, including shoulder: Secondary | ICD-10-CM | POA: Diagnosis not present

## 2024-03-12 DIAGNOSIS — L820 Inflamed seborrheic keratosis: Secondary | ICD-10-CM | POA: Diagnosis not present

## 2024-03-12 DIAGNOSIS — D2272 Melanocytic nevi of left lower limb, including hip: Secondary | ICD-10-CM | POA: Diagnosis not present

## 2024-03-13 ENCOUNTER — Ambulatory Visit: Admitting: Family Medicine

## 2024-03-18 ENCOUNTER — Ambulatory Visit (INDEPENDENT_AMBULATORY_CARE_PROVIDER_SITE_OTHER): Admitting: Family Medicine

## 2024-03-18 ENCOUNTER — Other Ambulatory Visit: Payer: Self-pay | Admitting: *Deleted

## 2024-03-18 VITALS — BP 118/78 | Ht 65.0 in | Wt 150.0 lb

## 2024-03-18 DIAGNOSIS — M810 Age-related osteoporosis without current pathological fracture: Secondary | ICD-10-CM

## 2024-03-18 NOTE — Patient Instructions (Signed)
 We will look into the Evenity for you. Continue your supplementation as you have been and exercising.

## 2024-03-19 NOTE — Progress Notes (Signed)
 PCP: Gayl Males, MD  Subjective:   HPI: Patient is a 54 y.o. female here for osteoporosis.  Patient referred here by Dr. Orpha to consider starting Evenity. She has history of known osteoporosis.  Has tried fosamax but had severe bone pain and fever.  Each prolia dose also had severe bone pain and a near-syncopal episode. History of Hip, Spine, or Wrist Fracture: Humerus fracture Heart disease or stroke: no Cancer: no Kidney Disease: no Gastric/Peptic Ulcer: no Gastric bypass surgery: no Severe GERD: no History of seizures: no Age at Menopause: 55 Calcium intake: none Vitamin D intake: 2000 international units daily Hormone replacement therapy: yes, briefly Smoking history: never Alcohol : 0-2 per week Exercise: walking 5 days/week, resistance training 3-4 days/week Major dental work in past year: no Parents with hip/spine fracture: no but 3 aunts with severe osteoporosis and had spine fractures  Past Medical History:  Diagnosis Date   Hx of adenomatous polyp of colon 08/22/2019    Current Outpatient Medications on File Prior to Visit  Medication Sig Dispense Refill   Calcium Carbonate-Vit D-Min (CALCIUM 1200 PO) Take by mouth.     mirabegron  ER (MYRBETRIQ ) 25 MG TB24 tablet Take 1 tablet (25 mg total) by mouth daily. 30 tablet 5   Multiple Vitamin (MULTIVITAMIN) tablet Take 1 tablet by mouth daily.     No current facility-administered medications on file prior to visit.    Past Surgical History:  Procedure Laterality Date   BREAST BIOPSY Left    CESAREAN SECTION     DILATION AND CURETTAGE OF UTERUS     x2    No Known Allergies  BP 118/78   Ht 5' 5 (1.651 m)   Wt 150 lb (68 kg)   LMP 11/03/2012   BMI 24.96 kg/m       No data to display              No data to display              Objective:  Physical Exam:  Gen: NAD, comfortable in exam room  Dexa 12/08/23 T scores: L spine -3.2, L hip -2.0, R hip -2.5 01/09/24 CMP normal (calcium  9.3) TSH 0.41 SPEP normal VItamin D 34   Assessment & Plan:  1. 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  She has not tolerated both bisphosphonates and Prolia with severe side effects.  Will look into evenity.  Discussed risks/benefits of this medication.  Vitamin D supplementation was increased after most recent low-normal vitamin D levels.    Total visit time 30 minutes including documentation

## 2024-03-20 ENCOUNTER — Telehealth: Payer: Self-pay | Admitting: *Deleted

## 2024-03-20 NOTE — Telephone Encounter (Addendum)
 Patient is ready for scheduling on or after: 04/01/24 BUY AND BILL  Out-of-pocket cost due at time of visit: $0  Primary: [employer] PPO Evenity co-insurance: 20% (approximately $504.17) Admin fee co-insurance: 20% (approximately $25)  OOP max: $5000 ($5000 met)  Deductible: n/a  Prior Auth: Approved Auth #: [id number] Valid: 03/23/24 - 03/23/25    ** This summary of benefits is an estimation of the patient's out-of-pocket cost. Exact cost may vary based on individual plan coverage.

## 2024-04-02 ENCOUNTER — Ambulatory Visit (INDEPENDENT_AMBULATORY_CARE_PROVIDER_SITE_OTHER): Admitting: Family Medicine

## 2024-04-02 DIAGNOSIS — M810 Age-related osteoporosis without current pathological fracture: Secondary | ICD-10-CM | POA: Insufficient documentation

## 2024-04-02 MED ORDER — ROMOSOZUMAB-AQQG 105 MG/1.17ML ~~LOC~~ SOSY
210.0000 mg | PREFILLED_SYRINGE | Freq: Once | SUBCUTANEOUS | Status: AC
  Administered 2024-04-02: 210 mg via SUBCUTANEOUS

## 2024-04-02 NOTE — Progress Notes (Signed)
 Patient is here for evenity injection #1. Patient received bilateral arm [place] evenity injections today. She tolerated injections well. She will return in 1 month for her next injection. Pt waited 15 mins to ensure no reaction to the medication.

## 2024-05-07 ENCOUNTER — Ambulatory Visit: Admitting: Family Medicine

## 2024-05-07 ENCOUNTER — Encounter: Payer: Self-pay | Admitting: Family Medicine

## 2024-05-07 DIAGNOSIS — M810 Age-related osteoporosis without current pathological fracture: Secondary | ICD-10-CM | POA: Diagnosis not present

## 2024-05-07 MED ORDER — ROMOSOZUMAB-AQQG 105 MG/1.17ML ~~LOC~~ SOSY
210.0000 mg | PREFILLED_SYRINGE | Freq: Once | SUBCUTANEOUS | Status: AC
  Administered 2024-05-07: 210 mg via SUBCUTANEOUS

## 2024-05-07 NOTE — Progress Notes (Signed)
 Patient is here for evenity injection #2. Patient received bilateral lower abdomen [place] evenity injections today. She tolerated injections well. She will return in 1 month for her next injection.

## 2024-06-05 DIAGNOSIS — N951 Menopausal and female climacteric states: Secondary | ICD-10-CM | POA: Diagnosis not present

## 2024-06-05 DIAGNOSIS — E0789 Other specified disorders of thyroid: Secondary | ICD-10-CM | POA: Diagnosis not present

## 2024-06-05 DIAGNOSIS — Z0000 Encounter for general adult medical examination without abnormal findings: Secondary | ICD-10-CM | POA: Diagnosis not present

## 2024-06-05 DIAGNOSIS — Z79899 Other long term (current) drug therapy: Secondary | ICD-10-CM | POA: Diagnosis not present

## 2024-06-05 DIAGNOSIS — Z1389 Encounter for screening for other disorder: Secondary | ICD-10-CM | POA: Diagnosis not present

## 2024-06-07 ENCOUNTER — Ambulatory Visit: Admitting: Family Medicine

## 2024-06-07 VITALS — Ht 65.0 in

## 2024-06-07 DIAGNOSIS — M810 Age-related osteoporosis without current pathological fracture: Secondary | ICD-10-CM

## 2024-06-07 MED ORDER — ROMOSOZUMAB-AQQG 105 MG/1.17ML ~~LOC~~ SOSY
210.0000 mg | PREFILLED_SYRINGE | Freq: Once | SUBCUTANEOUS | Status: AC
  Administered 2024-06-07: 210 mg via SUBCUTANEOUS

## 2024-06-07 NOTE — Progress Notes (Unsigned)
 Patient is here for evenity  injection #3. Patient received bilateral lower abdomen [place] evenity  injections today. She tolerated injections well. She will return in 1 month for her next injection.

## 2024-06-21 NOTE — Telephone Encounter (Signed)
 2026 Evenity  benefits due to re-verify on [employer].

## 2024-07-10 ENCOUNTER — Ambulatory Visit: Admitting: Family Medicine

## 2024-07-11 ENCOUNTER — Ambulatory Visit (INDEPENDENT_AMBULATORY_CARE_PROVIDER_SITE_OTHER): Admitting: Family Medicine

## 2024-07-11 VITALS — Ht 68.0 in

## 2024-07-11 DIAGNOSIS — M810 Age-related osteoporosis without current pathological fracture: Secondary | ICD-10-CM

## 2024-07-11 MED ORDER — ROMOSOZUMAB-AQQG 105 MG/1.17ML ~~LOC~~ SOSY
210.0000 mg | PREFILLED_SYRINGE | Freq: Once | SUBCUTANEOUS | Status: AC
  Administered 2024-07-11: 210 mg via SUBCUTANEOUS

## 2024-07-11 NOTE — Progress Notes (Signed)
 Patient is here for evenity  injection #4. Patient received bilateral lower abdomen [place] evenity  injections today. She tolerated injections well. She will return in 1 month for her next injection.

## 2024-07-12 NOTE — Telephone Encounter (Signed)
 Patient is ready for scheduling on or after: 08/13/24  BUY AND BILL  Out-of-pocket cost due at time of visit: $5000 (OR $0 if utilizes [employer] copay assistance)  Primary: [employer] [place] Evenity  co-insurance: 20% (approximately $508) Admin fee co-insurance: 20% (approximately $25)  Deductible: $5000 ($26met) Evenity  and administration are covered at 100% after a $5000 deductible ($0 met). Deductible does not contribute to a $5000 out of pocket max ($5 met).  Prior Auth: Approved Auth #: [id number] Valid: 03/23/24 - 03/23/25    ** This summary of benefits is an estimation of the patient's out-of-pocket cost. Exact cost may vary based on individual plan coverage.

## 2024-07-13 ENCOUNTER — Encounter: Payer: Self-pay | Admitting: *Deleted

## 2024-08-13 ENCOUNTER — Ambulatory Visit: Admitting: Family Medicine
